# Patient Record
Sex: Male | Born: 1955
Health system: Southern US, Community
[De-identification: ages and names within clinical notes are randomized; demographics above are authoritative.]

## PROBLEM LIST (undated history)

## (undated) DIAGNOSIS — R7302 Impaired glucose tolerance (oral): Secondary | ICD-10-CM

## (undated) DIAGNOSIS — S0285XA Fracture of orbit, unspecified, initial encounter for closed fracture: Secondary | ICD-10-CM

## (undated) DIAGNOSIS — R2689 Other abnormalities of gait and mobility: Secondary | ICD-10-CM

## (undated) DIAGNOSIS — R7303 Prediabetes: Secondary | ICD-10-CM

## (undated) DIAGNOSIS — H269 Unspecified cataract: Secondary | ICD-10-CM

## (undated) DIAGNOSIS — R209 Unspecified disturbances of skin sensation: Secondary | ICD-10-CM

## (undated) DIAGNOSIS — I10 Essential (primary) hypertension: Secondary | ICD-10-CM

## (undated) DIAGNOSIS — K219 Gastro-esophageal reflux disease without esophagitis: Secondary | ICD-10-CM

## (undated) DIAGNOSIS — T7840XA Allergy, unspecified, initial encounter: Secondary | ICD-10-CM

## (undated) DIAGNOSIS — R1319 Other dysphagia: Secondary | ICD-10-CM

## (undated) DIAGNOSIS — J309 Allergic rhinitis, unspecified: Secondary | ICD-10-CM

## (undated) DIAGNOSIS — K2270 Barrett's esophagus without dysplasia: Secondary | ICD-10-CM

## (undated) DIAGNOSIS — L259 Unspecified contact dermatitis, unspecified cause: Secondary | ICD-10-CM

## (undated) DIAGNOSIS — E119 Type 2 diabetes mellitus without complications: Secondary | ICD-10-CM

## (undated) DIAGNOSIS — S0993XA Unspecified injury of face, initial encounter: Secondary | ICD-10-CM

## (undated) DIAGNOSIS — I4949 Other premature depolarization: Secondary | ICD-10-CM

## (undated) HISTORY — DX: Essential (primary) hypertension: I10

## (undated) HISTORY — DX: Gastro-esophageal reflux disease without esophagitis: K21.9

## (undated) HISTORY — DX: Prediabetes: R73.03

## (undated) HISTORY — DX: Impaired glucose tolerance (oral): R73.02

## (undated) HISTORY — DX: Allergic rhinitis, unspecified: J30.9

## (undated) HISTORY — DX: Other premature depolarization: I49.49

## (undated) HISTORY — PX: TONSILLECTOMY: SHX5217

## (undated) HISTORY — DX: Allergy, unspecified, initial encounter: T78.40XA

## (undated) HISTORY — DX: Other dysphagia: R13.19

## (undated) HISTORY — DX: Unspecified cataract: H26.9

## (undated) HISTORY — PX: ESOPHAGOGASTRODUODENOSCOPY: SHX1529

## (undated) HISTORY — DX: Type 2 diabetes mellitus without complications: E11.9

## (undated) HISTORY — DX: Barrett's esophagus without dysplasia: K22.70

## (undated) HISTORY — PX: COLONOSCOPY: SHX174

## (undated) HISTORY — DX: Unspecified disturbances of skin sensation: R20.9

## (undated) HISTORY — DX: Unspecified injury of face, initial encounter: S09.93XA

## (undated) HISTORY — PX: LUMBAR DISC SURGERY: SHX700

## (undated) HISTORY — DX: Unspecified contact dermatitis, unspecified cause: L25.9

## (undated) HISTORY — DX: Fracture of orbit, unspecified, initial encounter for closed fracture: S02.85XA

## (undated) HISTORY — PX: UPPER GASTROINTESTINAL ENDOSCOPY: SHX188

## (undated) HISTORY — DX: Other abnormalities of gait and mobility: R26.89

## (undated) HISTORY — PX: CERVICAL DISCECTOMY: SHX98

---

## 2001-10-15 ENCOUNTER — Encounter: Admission: RE | Admit: 2001-10-15 | Discharge: 2001-10-15 | Payer: Self-pay | Admitting: Orthopaedic Surgery

## 2001-10-15 ENCOUNTER — Encounter: Payer: Self-pay | Admitting: Orthopaedic Surgery

## 2001-11-12 ENCOUNTER — Encounter: Payer: Self-pay | Admitting: Orthopaedic Surgery

## 2001-11-19 ENCOUNTER — Inpatient Hospital Stay (HOSPITAL_COMMUNITY): Admission: RE | Admit: 2001-11-19 | Discharge: 2001-11-20 | Payer: Self-pay | Admitting: Orthopaedic Surgery

## 2001-11-19 ENCOUNTER — Encounter: Payer: Self-pay | Admitting: Orthopaedic Surgery

## 2001-11-20 ENCOUNTER — Encounter: Payer: Self-pay | Admitting: Orthopaedic Surgery

## 2001-11-30 ENCOUNTER — Encounter (INDEPENDENT_AMBULATORY_CARE_PROVIDER_SITE_OTHER): Payer: Self-pay | Admitting: *Deleted

## 2001-11-30 ENCOUNTER — Encounter: Payer: Self-pay | Admitting: Orthopaedic Surgery

## 2001-11-30 ENCOUNTER — Emergency Department (HOSPITAL_COMMUNITY): Admission: EM | Admit: 2001-11-30 | Discharge: 2001-11-30 | Payer: Self-pay | Admitting: Emergency Medicine

## 2003-06-30 HISTORY — PX: ESOPHAGOGASTRODUODENOSCOPY: SHX1529

## 2003-07-24 ENCOUNTER — Encounter: Payer: Self-pay | Admitting: Gastroenterology

## 2005-10-30 ENCOUNTER — Ambulatory Visit: Payer: Self-pay | Admitting: Internal Medicine

## 2006-11-06 DIAGNOSIS — C4491 Basal cell carcinoma of skin, unspecified: Secondary | ICD-10-CM

## 2006-11-06 HISTORY — DX: Basal cell carcinoma of skin, unspecified: C44.91

## 2007-09-23 ENCOUNTER — Encounter: Payer: Self-pay | Admitting: Internal Medicine

## 2008-06-15 ENCOUNTER — Ambulatory Visit: Payer: Self-pay | Admitting: Internal Medicine

## 2008-06-15 LAB — CONVERTED CEMR LAB
ALT: 47 units/L (ref 0–53)
AST: 42 units/L — ABNORMAL HIGH (ref 0–37)
Albumin: 4.4 g/dL (ref 3.5–5.2)
Alkaline Phosphatase: 63 units/L (ref 39–117)
BUN: 9 mg/dL (ref 6–23)
Basophils Absolute: 0.1 10*3/uL (ref 0.0–0.1)
Basophils Relative: 0.7 % (ref 0.0–3.0)
Bilirubin Urine: NEGATIVE
Bilirubin, Direct: 0.1 mg/dL (ref 0.0–0.3)
Blood in Urine, dipstick: NEGATIVE
CO2: 32 meq/L (ref 19–32)
Calcium: 9.4 mg/dL (ref 8.4–10.5)
Chloride: 105 meq/L (ref 96–112)
Cholesterol: 172 mg/dL (ref 0–200)
Creatinine, Ser: 1.2 mg/dL (ref 0.4–1.5)
Eosinophils Absolute: 0.1 10*3/uL (ref 0.0–0.7)
Eosinophils Relative: 1.7 % (ref 0.0–5.0)
GFR calc Af Amer: 82 mL/min
GFR calc non Af Amer: 68 mL/min
Glucose, Bld: 112 mg/dL — ABNORMAL HIGH (ref 70–99)
Glucose, Urine, Semiquant: NEGATIVE
HCT: 49.1 % (ref 39.0–52.0)
HDL: 32.7 mg/dL — ABNORMAL LOW (ref >39.0)
Hemoglobin: 17 g/dL (ref 13.0–17.0)
Ketones, urine, test strip: NEGATIVE
LDL Cholesterol: 111 mg/dL — ABNORMAL HIGH (ref 0–99)
Lymphocytes Relative: 33.3 % (ref 12.0–46.0)
MCHC: 34.7 g/dL (ref 30.0–36.0)
MCV: 91.4 fL (ref 78.0–100.0)
Monocytes Absolute: 0.7 10*3/uL (ref 0.1–1.0)
Monocytes Relative: 8.8 % (ref 3.0–12.0)
Neutro Abs: 4.2 10*3/uL (ref 1.4–7.7)
Neutrophils Relative %: 55.5 % (ref 43.0–77.0)
Nitrite: NEGATIVE
PSA: 0.61 ng/mL (ref 0.10–4.00)
Platelets: 239 10*3/uL (ref 150–400)
Potassium: 4.4 meq/L (ref 3.5–5.1)
Protein, U semiquant: NEGATIVE
RBC: 5.37 M/uL (ref 4.22–5.81)
RDW: 13.1 % (ref 11.5–14.6)
Sodium: 145 meq/L (ref 135–145)
Specific Gravity, Urine: 1.015
TSH: 1.96 microintl units/mL (ref 0.35–5.50)
Total Bilirubin: 0.9 mg/dL (ref 0.3–1.2)
Total CHOL/HDL Ratio: 5.3
Total Protein: 7.5 g/dL (ref 6.0–8.3)
Triglycerides: 141 mg/dL (ref 0–149)
Urobilinogen, UA: 0.2
VLDL: 28 mg/dL (ref 0–40)
WBC Urine, dipstick: NEGATIVE
WBC: 7.7 10*3/uL (ref 4.5–10.5)
pH: 5

## 2008-06-19 ENCOUNTER — Ambulatory Visit: Payer: Self-pay | Admitting: Internal Medicine

## 2008-06-19 DIAGNOSIS — I10 Essential (primary) hypertension: Secondary | ICD-10-CM | POA: Insufficient documentation

## 2008-06-19 DIAGNOSIS — R209 Unspecified disturbances of skin sensation: Secondary | ICD-10-CM

## 2008-06-19 DIAGNOSIS — R202 Paresthesia of skin: Secondary | ICD-10-CM | POA: Insufficient documentation

## 2008-06-19 DIAGNOSIS — K219 Gastro-esophageal reflux disease without esophagitis: Secondary | ICD-10-CM

## 2008-06-19 DIAGNOSIS — J309 Allergic rhinitis, unspecified: Secondary | ICD-10-CM | POA: Insufficient documentation

## 2008-06-19 HISTORY — DX: Unspecified disturbances of skin sensation: R20.9

## 2008-06-19 HISTORY — DX: Essential (primary) hypertension: I10

## 2008-06-19 HISTORY — DX: Gastro-esophageal reflux disease without esophagitis: K21.9

## 2008-06-19 HISTORY — DX: Allergic rhinitis, unspecified: J30.9

## 2008-07-03 ENCOUNTER — Ambulatory Visit: Payer: Self-pay | Admitting: Internal Medicine

## 2008-12-16 DIAGNOSIS — R071 Chest pain on breathing: Secondary | ICD-10-CM | POA: Insufficient documentation

## 2008-12-21 ENCOUNTER — Encounter: Payer: Self-pay | Admitting: Internal Medicine

## 2008-12-21 DIAGNOSIS — K2270 Barrett's esophagus without dysplasia: Secondary | ICD-10-CM | POA: Insufficient documentation

## 2008-12-21 HISTORY — DX: Barrett's esophagus without dysplasia: K22.70

## 2008-12-25 ENCOUNTER — Encounter: Payer: Self-pay | Admitting: Internal Medicine

## 2008-12-25 ENCOUNTER — Ambulatory Visit: Payer: Self-pay | Admitting: Family Medicine

## 2008-12-25 DIAGNOSIS — M545 Low back pain, unspecified: Secondary | ICD-10-CM | POA: Insufficient documentation

## 2008-12-25 DIAGNOSIS — I4949 Other premature depolarization: Secondary | ICD-10-CM

## 2008-12-25 HISTORY — DX: Other premature depolarization: I49.49

## 2008-12-25 LAB — CONVERTED CEMR LAB
Bilirubin Urine: NEGATIVE
Blood in Urine, dipstick: NEGATIVE
Glucose, Urine, Semiquant: NEGATIVE
Ketones, urine, test strip: NEGATIVE
Nitrite: NEGATIVE
Protein, U semiquant: NEGATIVE
Specific Gravity, Urine: 1.02
Urobilinogen, UA: 0.2
WBC Urine, dipstick: NEGATIVE
pH: 6

## 2008-12-29 ENCOUNTER — Ambulatory Visit: Payer: Self-pay | Admitting: Internal Medicine

## 2008-12-29 DIAGNOSIS — R1012 Left upper quadrant pain: Secondary | ICD-10-CM | POA: Insufficient documentation

## 2009-01-01 ENCOUNTER — Encounter: Admission: RE | Admit: 2009-01-01 | Discharge: 2009-01-01 | Payer: Self-pay | Admitting: Internal Medicine

## 2009-02-09 ENCOUNTER — Ambulatory Visit: Payer: Self-pay | Admitting: Gastroenterology

## 2009-02-09 DIAGNOSIS — R1319 Other dysphagia: Secondary | ICD-10-CM | POA: Insufficient documentation

## 2009-02-09 DIAGNOSIS — K921 Melena: Secondary | ICD-10-CM | POA: Insufficient documentation

## 2009-02-09 HISTORY — DX: Other dysphagia: R13.19

## 2009-03-08 ENCOUNTER — Ambulatory Visit: Payer: Self-pay | Admitting: Gastroenterology

## 2009-03-08 ENCOUNTER — Encounter: Payer: Self-pay | Admitting: Gastroenterology

## 2009-03-10 ENCOUNTER — Encounter: Payer: Self-pay | Admitting: Gastroenterology

## 2009-06-25 ENCOUNTER — Ambulatory Visit: Payer: Self-pay | Admitting: Internal Medicine

## 2009-06-25 DIAGNOSIS — L259 Unspecified contact dermatitis, unspecified cause: Secondary | ICD-10-CM | POA: Insufficient documentation

## 2009-06-25 HISTORY — DX: Unspecified contact dermatitis, unspecified cause: L25.9

## 2010-06-28 NOTE — Miscellaneous (Signed)
Summary: Orders Update  Clinical Lists Changes  Problems: Added new problem of BARRETTS ESOPHAGUS (ICD-530.85) Orders: Added new Referral order of Gastroenterology Referral (GI) - Signed

## 2010-06-28 NOTE — Assessment & Plan Note (Signed)
Summary: ROV/MM   Vital Signs:  Patient profile:   55 year old male Height:      75 inches Weight:      260 pounds BMI:     32.62 Temp:     98.2 degrees F oral Pulse rate:   80 / minute Resp:     14 per minute BP sitting:   140 / 90  (left arm)  Vitals Entered By: Willy Eddy, LPN (December 29, 2008 10:55 AM)  CC:  continues to c/o llq pain.  History of Present Illness: 55 year old patient who presents with a two week history of left upper quadrant abdominal discomfort.  Pain seems worse at night and is often aggravated by twisting and stretching type maneuvers.  Denies any constitutional symptoms.  No nausea, vomiting, or change in his bowel habits.  Denies any trauma or unusual activities.  At times he feels tenderness in the left upper quadrant region  the live and Allergies: No Known Drug Allergies  Past History:  Past Medical History: Reviewed history from 06/19/2008 and no changes required. Hypertension Allergic rhinitis impaired glucose tolerance GERD/Barrett's esophagus  Physical Exam  General:  Well-developed,well-nourished,in no acute distress; alert,appropriate and cooperative throughout examination Abdomen:  Bowel sounds positive,abdomen soft and non-tender without masses, organomegaly or hernias noted. Liver edge may have been palpable on full inspiration   Impression & Recommendations:  Problem # 1:  ABDOMINAL PAIN, LEFT UPPER QUADRANT (ICD-789.02)  Orders: Ultrasound (Ultrasound) Radiology Referral (Radiology)  Complete Medication List: 1)  Prilosec Otc 20 Mg Tbec (Omeprazole magnesium) .Marland Kitchen.. 1 once daily 2)  Fluticasone Propionate 50 Mcg/act Susp (Fluticasone propionate) .... Use daily 3)  Benazepril Hcl 20 Mg Tabs (Benazepril hcl) .... One daily  Patient Instructions: 1)  alleve  two tablets twice daily 2)  abdominal ultrasound as scheduled

## 2010-06-28 NOTE — Assessment & Plan Note (Signed)
Summary: PAIN AROUND KIDNEY AREA/MHF   Vital Signs:  Patient profile:   55 year old male Height:      75 inches Weight:      258 pounds BMI:     32.36 Temp:     97.9 degrees F oral BP sitting:   110 / 80  (left arm) Cuff size:   regular  Vitals Entered By: [name] (December 25, 2008 3:33 PM)  Reason for Visit lower back pain  History of Present Illness: Phillip Perry is a 55 year old male patient.  Dr. Kirtland Bouchard and comes in today for evaluation of pain in his left rib area.  X 10 days, plus  He states about 10 days ago he awoke with discomfort in the left side of his ribs.  He points to the 10th 11th, 12th ribs in the posterior axillary line.  He states the pain is dull.  It comes and goes and lasts for a second or two.  His associate with movement.  Does not radiate.  He states his pain on a scale of one to 10 as a 6.  He denies any history of trauma.  Review of systems no fever, nausea, vomiting, diarrhea, change in urinary habits.  He's had no history of kidney stones.  No weight loss.  He has an underlying history of hypertension.  He takes an aspirin 20 mg daily.  BP 110/80.  However, his pulse is regular.  EKG will be done.  Patient is unaware of irregular heart rate  Allergies (verified): No Known Drug Allergies  Past History:  Past medical, surgical, family and social histories (including risk factors) reviewed, and no changes noted (except as noted below).  Past Medical History: Reviewed history from 06/19/2008 and no changes required. Hypertension Allergic rhinitis impaired glucose tolerance GERD/Barrett's esophagus  Past Surgical History: Reviewed history from 06/19/2008 and no changes required. status post EGD February 2005 C5-6 diskectomy 2003 ( [facility]) Tonsillectomy  Age 79  Family History: Reviewed history from 06/19/2008 and no changes required. father died age 50, esophageal cancer mother history of type 2 diabetes  Maternal grandfather, bladder cancer maternal  grandmother, coronary artery disease, advanced age paternal grandfather died in his early 17s of unclear causes  One sister is well  Social History: Reviewed history from 06/19/2008 and no changes required. Married Never Smoked 69 year old daughter  Review of Systems      See HPI  Physical Exam  General:  Well-developed,well-nourished,in no acute distress; alert,appropriate and cooperative throughout examination Chest Wall:  palpable tenderness left posterior chest wall, 10th, 11th, 12th rib area Lungs:  Normal respiratory effort, chest expands symmetrically. Lungs are clear to auscultation, no crackles or wheezes. Heart:  normal cardiac exam, except pulse is irregularly irregular Abdomen:  Bowel sounds positive,abdomen soft and non-tender without masses, organomegaly or hernias noted.   Impression & Recommendations:  Problem # 1:  CHEST WALL PAIN, ACUTE (ZOX-096.04) Assessment New  Problem # 2:  PREMATURE VENTRICULAR CONTRACTIONS (ICD-427.69) Assessment: New  Complete Medication List: 1)  Prilosec Otc 20 Mg Tbec (Omeprazole magnesium) .Marland Kitchen.. 1 once daily 2)  Fluticasone Propionate 50 Mcg/act Susp (Fluticasone propionate) .... Use daily 3)  Benazepril Hcl 20 Mg Tabs (Benazepril hcl) .... One daily  Other Orders: UA Dipstick w/o Micro (automated)  (81003) EKG w/ Interpretation (93000)  Patient Instructions: 1)  stay complete, caffeine free diet. 2)  Take Motrin 600 mg 3 times a day with food for your back pain. 3)  See Dr. Kirtland Bouchard. next Tuesday  for follow-up  Laboratory Results   Urine Tests    Routine Urinalysis   Color: yellow Appearance: Clear Glucose: negative   (Normal Range: Negative) Bilirubin: negative   (Normal Range: Negative) Ketone: negative   (Normal Range: Negative) Spec. Gravity: 1.020   (Normal Range: 1.003-1.035) Blood: negative   (Normal Range: Negative) pH: 6.0   (Normal Range: 5.0-8.0) Protein: negative   (Normal Range: Negative) Urobilinogen:  0.2   (Normal Range: 0-1) Nitrite: negative   (Normal Range: Negative) Leukocyte Esterace: negative   (Normal Range: Negative)    Comments: Joanne Chars CMA  December 25, 2008 3:49 PM

## 2010-06-28 NOTE — Assessment & Plan Note (Signed)
Summary: 2 wk rov/njr   Vital Signs:  Patient Profile:   55 Years Old Male Height:     75 inches Weight:      255 pounds BP sitting:   116 / 82  (left arm) Cuff size:   large  Vitals Entered By: Raechel Ache, RN (July 03, 2008 9:40 AM)                 Chief Complaint:  2 week ROV.Marland Kitchen  History of Present Illness: 55 year old patient seen today for follow-up of his hypertension.  He was placed on benazapril last visit;  he has tolerated his medications well.  He has a history of gastroesophageal reflux disease and allergic rhinitis, which have been stable     Current Allergies: No known allergies   Past Medical History:    Reviewed history from 06/19/2008 and no changes required:       Hypertension       Allergic rhinitis       impaired glucose tolerance       GERD/Barrett's esophagus      Physical Exam  General:     Well-developed,well-nourished,in no acute distress; alert,appropriate and cooperative throughout examination;  110/74    Impression & Recommendations:  Problem # 1:  HYPERTENSION (ICD-401.9)  His updated medication list for this problem includes:    Benazepril Hcl 20 Mg Tabs (Benazepril hcl) ..... One daily   Problem # 2:  ALLERGIC RHINITIS (ICD-477.9)  His updated medication list for this problem includes:    Fluticasone Propionate 50 Mcg/act Susp (Fluticasone propionate) ..... Use daily   Complete Medication List: 1)  Prilosec Otc 20 Mg Tbec (Omeprazole magnesium) .Marland Kitchen.. 1 once daily 2)  Fluticasone Propionate 50 Mcg/act Susp (Fluticasone propionate) .... Use daily 3)  Benazepril Hcl 20 Mg Tabs (Benazepril hcl) .... One daily   Patient Instructions: 1)  Please schedule a follow-up appointment in 6 months. 2)  Limit your Sodium (Salt). 3)  It is important that you exercise regularly at least 20 minutes 5 times a week. If you develop chest pain, have severe difficulty breathing, or feel very tired , stop exercising immediately and  seek medical attention. 4)  You need to lose weight. Consider a lower calorie diet and regular exercise.  5)  Check your Blood Pressure regularly. If it is above:  140/90 you should make an appointment.   Prescriptions: BENAZEPRIL HCL 20 MG TABS (BENAZEPRIL HCL) one daily  #90 x 6   Entered and Authorized by:   Gordy Savers  MD   Signed by:   Gordy Savers  MD on 07/03/2008   Method used:   Print then Give to Patient   RxID:   [id number]

## 2010-06-28 NOTE — Consult Note (Signed)
Summary: Dr Otelia Sergeant note  Dr Otelia Sergeant note   Imported By: Kassie Mends 09/27/2007 09:51:29  _____________________________________________________________________  External Attachment:    Type:   Image     Comment:   Dr Otelia Sergeant note

## 2010-06-28 NOTE — Assessment & Plan Note (Signed)
Summary: cpx/mhf   Vital Signs:  Patient Profile:   55 Years Old Male Height:     75 inches Weight:      255 pounds Temp:     98.4 degrees F oral Pulse rate:   84 / minute Pulse rhythm:   regular BP sitting:   134 / 108  (left arm) Cuff size:   regular  Vitals Entered By: Raechel Ache, RN (June 19, 2008 4:09 PM)                 Chief Complaint:  CPX and labs done. Concerned about feet; funny sensation on bottoms..  History of Present Illness: 55 year old patient who is seen today for an annual exam.  He has a history of Gastrosoft reflux disease and Barrett's esophagus.  He is on chronic omeprazole.  He also has a history of allergic rhinitis and takes Allegra-D chronically.  For the past two years, he has had nonprogressive mild paresthesias involving the plantar surface of the toes.  He does have a family history of diabetes    Current Allergies: No known allergies   Past Medical History:    Reviewed history and no changes required:       Hypertension       Allergic rhinitis       impaired glucose tolerance       GERD/Barrett's esophagus  Past Surgical History:    Reviewed history and no changes required:       status post EGD February 2005       C5-6 diskectomy 2003 ( [facility])       Tonsillectomy  Age 81   Family History:    Reviewed history and no changes required:       father died age 48, esophageal cancer       mother history of type 2 diabetes              Maternal grandfather, bladder cancer       maternal grandmother, coronary artery disease, advanced age       paternal grandfather died in his early 73s of unclear causes              One sister is well  Social History:    Reviewed history and no changes required:       Married       Never Smoked       47 year old daughter   Risk Factors:  Tobacco use:  never   Review of Systems  The patient denies anorexia, fever, weight loss, weight gain, vision loss, decreased hearing, hoarseness,  chest pain, syncope, dyspnea on exertion, peripheral edema, prolonged cough, headaches, hemoptysis, abdominal pain, melena, hematochezia, severe indigestion/heartburn, hematuria, incontinence, genital sores, muscle weakness, suspicious skin lesions, transient blindness, difficulty walking, depression, unusual weight change, abnormal bleeding, enlarged lymph nodes, angioedema, breast masses, and testicular masses.     Physical Exam  General:     Well-developed,well-nourished,in no acute distress; alert,appropriate and cooperative throughout examination  160/100 Head:     Normocephalic and atraumatic without obvious abnormalities. No apparent alopecia or balding. Eyes:     No corneal or conjunctival inflammation noted. EOMI. Perrla. Funduscopic exam benign, without hemorrhages, exudates or papilledema. Vision grossly normal. Ears:     External ear exam shows no significant lesions or deformities.  Otoscopic examination reveals clear canals, tympanic membranes are intact bilaterally without bulging, retraction, inflammation or discharge. Hearing is grossly normal bilaterally. Mouth:     Oral mucosa and  oropharynx without lesions or exudates.  Teeth in good repair. Neck:     No deformities, masses, or tenderness noted. Chest Wall:     No deformities, masses, tenderness or gynecomastia noted. Breasts:     No masses or gynecomastia noted Lungs:     Normal respiratory effort, chest expands symmetrically. Lungs are clear to auscultation, no crackles or wheezes. Heart:     Normal rate and regular rhythm. S1 and S2 normal without gallop, murmur, click, rub or other extra sounds. Abdomen:     Bowel sounds positive,abdomen soft and non-tender without masses, organomegaly or hernias noted. Rectal:     No external abnormalities noted. Normal sphincter tone. No rectal masses or tenderness. Genitalia:     Testes bilaterally descended without nodularity, tenderness or masses. No scrotal masses or  lesions. No penis lesions or urethral discharge. Prostate:     Prostate gland firm and smooth, no enlargement, nodularity, tenderness, mass, asymmetry or induration. Msk:     No deformity or scoliosis noted of thoracic or lumbar spine.   Pulses:     R and L carotid,radial,femoral,dorsalis pedis and posterior tibial pulses are full and equal bilaterally Extremities:     No clubbing, cyanosis, edema, or deformity noted with normal full range of motion of all joints.   Neurologic:     No cranial nerve deficits noted. Station and gait are normal. Plantar reflexes are down-going bilaterally. DTRs are symmetrical throughout. Sensory, motor and coordinative functions appear intact. Skin:     Intact without suspicious lesions or rashes Cervical Nodes:     No lymphadenopathy noted Axillary Nodes:     No palpable lymphadenopathy Inguinal Nodes:     No significant adenopathy Psych:     Cognition and judgment appear intact. Alert and cooperative with normal attention span and concentration. No apparent delusions, illusions, hallucinations    Impression & Recommendations:  Problem # 1:  HYPERTENSION (ICD-401.9) will place on a low-sodium diet, placed on Bowser pill 20 mg daily and recheck in 3 weeks His updated medication list for this problem includes:    Benazepril Hcl 20 Mg Tabs (Benazepril hcl) ..... One daily   Problem # 2:  ALLERGIC RHINITIS (ICD-477.9) will discontinue Claritin-D and placed on antihistamine only along with nasal steroid His updated medication list for this problem includes:    Fluticasone Propionate 50 Mcg/act Susp (Fluticasone propionate) ..... Use daily   Problem # 3:  GERD (ICD-530.81)  His updated medication list for this problem includes:    Prilosec Otc 20 Mg Tbec (Omeprazole magnesium) .Marland Kitchen... 1 once daily need screening colonoscopy and follow-up EGD  Problem # 4:  PARESTHESIA (ICD-782.0)  Complete Medication List: 1)  Prilosec Otc 20 Mg Tbec (Omeprazole  magnesium) .Marland Kitchen.. 1 once daily 2)  Fluticasone Propionate 50 Mcg/act Susp (Fluticasone propionate) .... Use daily 3)  Benazepril Hcl 20 Mg Tabs (Benazepril hcl) .... One daily   Patient Instructions: 1)  Please schedule a follow-up appointment in 2 weeks. 2)  Limit your Sodium (Salt). 3)  Avoid foods high in acid (tomatoes, citrus juices, spicy foods). Avoid eating within two hours of lying down or before exercising. Do not over eat; try smaller more frequent meals. Elevate head of bed twelve inches when sleeping. 4)  It is important that you exercise regularly at least 20 minutes 5 times a week. If you develop chest pain, have severe difficulty breathing, or feel very tired , stop exercising immediately and seek medical attention. 5)  Schedule a colonoscopy/sigmoidoscopy to  help detect colon cancer.   Prescriptions: BENAZEPRIL HCL 20 MG TABS (BENAZEPRIL HCL) one daily  #90 x 0   Entered and Authorized by:   Gordy Savers  MD   Signed by:   Gordy Savers  MD on 06/19/2008   Method used:   Print then Give to Patient   RxID:   [id number] FLUTICASONE PROPIONATE 50 MCG/ACT SUSP (FLUTICASONE PROPIONATE) use daily  #1 x 6   Entered and Authorized by:   Gordy Savers  MD   Signed by:   Gordy Savers  MD on 06/19/2008   Method used:   Print then Give to Patient   RxID:   [id number] PRILOSEC OTC 20 MG TBEC (OMEPRAZOLE MAGNESIUM) 1 once daily  #90 x 6   Entered and Authorized by:   Gordy Savers  MD   Signed by:   Gordy Savers  MD on 06/19/2008   Method used:   Print then Give to Patient   RxID:   [id number]

## 2010-06-28 NOTE — Procedures (Signed)
Summary: Upper Endoscopy  Patient: Phillip Perry Note: All result statuses are Final unless otherwise noted.  Tests: (1) Upper Endoscopy (EGD)   EGD Upper Endoscopy       DONE      [facility]      [address]      [place], [place]  [address]      ?      ENDOSCOPY PROCEDURE REPORT      ?      PATIENT:  Phillip Perry, Phillip Perry  MR#:  [id number]      BIRTHDATE:  [birth year], 53 yrs. old  GENDER:  male      ?      ENDOSCOPIST:  Venita Lick. Russella Dar, MD, Clementeen Graham      ?      PROCEDURE DATE:  03/08/2009      PROCEDURE:  EGD with snare polypectomy, and with biopsy      ASA CLASS:  Class II      INDICATIONS:  Barrett's Esophagus      ?      MEDICATIONS:  There was residual sedation effect present from      prior procedure, Fentanyl 25 mcg IV, Versed 4 mg IV      TOPICAL ANESTHETIC:  Exactacain Spray      ?      DESCRIPTION OF PROCEDURE:   After the risks benefits and      alternatives of the procedure were thoroughly explained, informed      consent was obtained.  The LB [id number] endoscope was      introduced through the mouth and advanced to the second portion of      the duodenum, without limitations.  The instrument was slowly      withdrawn as the mucosa was fully examined.      <<PROCEDUREIMAGES>>      ?      About 20 polyps were found in the body and fundus of the stomach.      They were 3 - 6 mm in size. 8 of the largest polyp was snared,      without cautery and removed. Polyps were retrieved and sent to      pathology.  Barrett's esophagus was found in the distal esophagus.      It was 1 - 2 cm in length. Multiple biopsies were obtained and      sent to pathology.  Otherwise the examination was normal.      Retroflexed views revealed a hiatal hernia, small.  The scope was      then withdrawn from the patient and the procedure completed.      COMPLICATIONS:  None      ?      ENDOSCOPIC IMPRESSION:      1) 3 - 6 mm, multiple polyps in the  body and fundus      2) 1 - 2 cm barrett's esophagus      3) A hiatal hernia      ?      RECOMMENDATIONS:      1) anti-reflux regimen      2) await pathology results      3) continue PPI      4) endoscopy in 3 years if no Barretts associated dysplasia      ?      Venita Lick. Russella Dar, MD, Clementeen Graham      ?      CC:  Gordy Savers, MD      ?  n.      eSIGNED:   Malcolm T. Stark at 03/08/2009 02:26 PM      ?      Trigg, Delarocha, [phone]  Note: An exclamation mark (!) indicates a result that was not dispersed into the flowsheet. Document Creation Date: 03/10/2009 8:49 AM _______________________________________________________________________  (1) Order result status: Final Collection or observation date-time: 03/08/2009 14:03 Requested date-time:  Receipt date-time:  Reported date-time:  Referring Physician:   Ordering Physician: Claudette Head [phone]) Specimen Source:  Source: Launa Grill Order Number: [id number] Lab site:   Appended Document: Upper Endoscopy     Procedures Next Due Date:    EGD: 02/2012

## 2010-06-28 NOTE — Assessment & Plan Note (Signed)
Summary: ? shingles ?/dm   Vital Signs:  Patient profile:   54 year old male Weight:      263 pounds BMI:     32.99 Temp:     98.7 degrees F oral BP sitting:   116 / 98  (left arm) Cuff size:   regular  Vitals Entered By: Raechel Ache, RN (June 25, 2009 3:59 PM) CC: C/o sore spot L forehead.   Primary Care Provider:  Eleonore Chiquito, MD  CC:  C/o sore spot L forehead.Marland Kitchen  History of Present Illness: 55 year old patient who is seen today for evaluation of a lesion involving his left for head region.  He first noted the area approximately 24 hours ago.  He felt this was a temple and has been squeezing the area and has expressed apparently a small amount of fluid.  He does have a remote history of genital herpes.  He was concerned about the possibility of shingles.  He has been applying topical antibiotic ointment.  Allergies: No Known Drug Allergies  Past History:  Past Medical History: Reviewed history from 06/19/2008 and no changes required. Hypertension Allergic rhinitis impaired glucose tolerance GERD/Barrett's esophagus  Review of Systems       The patient complains of suspicious skin lesions.    Physical Exam  General:  Well-developed,well-nourished,in no acute distress; alert,appropriate and cooperative throughout examination; repeat blood pressure 120/82 Skin:  a patchy area of erythema approximately 3 cm in diameter.  Two small excoriated areas were noted.   Impression & Recommendations:  Problem # 1:  DERMATITIS (ICD-692.9) this is a very nonspecific lesion.  It is fairly consistent with a herpetic lesion.  Doubt this represents shingles.  Will treat with local skin care and observe.  He will call if there is any worsening of the dermatitis  Complete Medication List: 1)  Prilosec Otc 20 Mg Tbec (Omeprazole magnesium) .Marland Kitchen.. 1 once daily 2)  Fluticasone Propionate 50 Mcg/act Susp (Fluticasone propionate) .... Use daily 3)  Benazepril Hcl 20 Mg Tabs  (Benazepril hcl) .... One daily 4)  Analpram-hc 1-2.5 % Crea (Hydrocortisone ace-pramoxine) .... Apply to rectum two times a day x 2 weeks.  Patient Instructions: 1)  call that the rash worsens 2)  Limit your Sodium (Salt) to less than 2 grams a day(slightly less than 1/2 a teaspoon) to prevent fluid retention, swelling, or worsening of symptoms. 3)  Check your Blood Pressure regularly. If it is above: 150/90  you should make an appointment.

## 2010-06-28 NOTE — Procedures (Signed)
Summary: Endoscopy   EGD  Procedure date:  07/24/2003  Findings:      Location: [facility]  Findings: Esophagitis (Barrett's) Hiatal Hernia  Patient Name: Phillip Perry, Phillip Perry. MRN:  Procedure Procedures: Panendoscopy (EGD) CPT: 43235.    with biopsy(s)/brushing(s). CPT: D1846139.  Personnel: Endoscopist: Venita Lick. Russella Dar, MD, Clementeen Graham.  Referred By: Gordy Savers, MD.  Exam Location: Exam performed in Outpatient Clinic. Outpatient  Patient Consent: Procedure, Alternatives, Risks and Benefits discussed, consent obtained, from patient. Consent was obtained by the RN.  Indications Symptoms: Reflux symptoms for >10 yrs,  History  Current Medications: Patient is not currently taking Coumadin.  Pre-Exam Physical: Performed Jul 24, 2003  Entire physical exam was normal.  Exam Exam Info: Maximum depth of insertion Duodenum, intended Duodenum. Vocal cords not visualized. Gastric retroflexion performed. ASA Classification: I. Tolerance: excellent.  Sedation Meds: Patient assessed and found to be appropriate for moderate (conscious) sedation. Fentanyl 100 mcg. given IV. Versed 10 mg. given IV. Cetacaine Spray 2 sprays given aerosolized.  Monitoring: BP and pulse monitoring done. Oximetry used. Supplemental O2 given  Findings BARRETT'S ESOPHAGUS: Proximal margin 40 cm from mouth,  distal margin 41 cm. No inflammation present. Biopsy/Barrett's taken. ICD9: Barrett's: 530.2. Comment: R/O short segment Barrett's.  HIATAL HERNIA: Regular, 3 cms. in length. ICD9: Hernia, Hiatal: 553.3. Normal: Fundus to Duodenal 2nd Portion.   Assessment  Diagnoses: 553.3: Hernia, Hiatal.  530.2: Barrett's.   Events  Unplanned Intervention: No unplanned interventions were required.  Unplanned Events: There were no complications. Plans Medication(s): Await pathology. Continue current medications. PPI: QAM,   Patient Education: Patient given standard instructions for:  Hiatal Hernia. Reflux.  Disposition: After procedure patient sent to recovery. After recovery patient sent home.  Scheduling: EGD, to [employer]. Russella Dar, MD, [facility], if Barrett's confirmed on bx around Jul 23, 2005.  Primary Care Provider, to Gordy Savers, MD,    This report was created from the original endoscopy report, which was reviewed and signed by the above listed endoscopist.   cc:  Gordy Savers, MD

## 2010-06-28 NOTE — Procedures (Signed)
Summary: Colonoscopy  Patient: Phillip Perry Note: All result statuses are Final unless otherwise noted.  Tests: (1) Colonoscopy (COL)   COL Colonoscopy           DONE (C)     [facility]     [address]     [place], [place]  [address]           COLONOSCOPY PROCEDURE REPORT           PATIENT:  Phillip Perry, Phillip Perry  MR#:  [id number]     BIRTHDATE:  13-Apr-1956, 53 yrs. old  GENDER:  male           ENDOSCOPIST:  Judie Petit T. Russella Dar, MD, [facility]           PROCEDURE DATE:  03/08/2009     PROCEDURE:  Colon w/ inj sclerosis of hemorrhoids     ASA CLASS:  Class II     INDICATIONS:  1) hematochezia           MEDICATIONS:   Fentanyl 75 mcg IV, Versed 10 mg IV           DESCRIPTION OF PROCEDURE:   After the risks benefits and     alternatives of the procedure were thoroughly explained, informed     consent was obtained.  Digital rectal exam was performed and     revealed no abnormalities.   The LB [id number]-[id number] endoscope     was introduced through the anus and advanced to the cecum, which     was identified by both the appendix and ileocecal valve, without     limitations.  The quality of the prep was excellent, using     MoviPrep.  The instrument was then slowly withdrawn as the colon     was fully examined.     <<PROCEDUREIMAGES>>           FINDINGS:  A normal appearing cecum, ileocecal valve, and     appendiceal orifice were identified. The ascending, hepatic     flexure, transverse, splenic flexure, descending, sigmoid colon,     and rectum appeared unremarkable.  Internal hemorrhoids were     found. They were large. Injection sclerosis of internal     hemorrhoids performed with 4 cc of 23.4% saline with injections     performed well above the dentate line.  Retroflexed views in the     rectum revealed no other findings other than those already     described.  The time to cecum =  2.5  minutes. The scope was then     withdrawn (time =  14.5  min) from the patient and the procedure     completed.           COMPLICATIONS:  None           ENDOSCOPIC IMPRESSION:     1) Normal colon     2) Internal hemorrhoids           RECOMMENDATIONS:     1) Continue current colorectal screening recommendations for     "routine risk" patients with a repeat colonoscopy in 10 years.           Venita Lick. Russella Dar, MD, [facility]           CC: Gordy Savers, MD           n.     REVISED:  03/16/2009 12:09 PM     eSIGNED:   Judie Petit T. Russella Dar at 03/16/2009  12:09 PM           Julian, [place], [id number]  Note: An exclamation mark (!) indicates a result that was not dispersed into the flowsheet. Document Creation Date: 03/16/2009 12:09 PM _______________________________________________________________________  (1) Order result status: Final Collection or observation date-time: 03/08/2009 14:00 Requested date-time:  Receipt date-time:  Reported date-time:  Referring Physician:   Ordering Physician: Claudette Head [id number]) Specimen Source:  Source: Launa Grill Order Number: [id number] Lab site:   Appended Document: Colonoscopy    Clinical Lists Changes  Observations: Changed observation from COLONNXTDUE: 02/2019 (03/10/2009 10:15) to COLONNXTDUE: 02/2019 (03/10/2009 10:15)

## 2010-07-17 ENCOUNTER — Other Ambulatory Visit: Payer: Self-pay | Admitting: Internal Medicine

## 2010-07-21 ENCOUNTER — Encounter (INDEPENDENT_AMBULATORY_CARE_PROVIDER_SITE_OTHER): Payer: BC Managed Care – PPO | Admitting: Internal Medicine

## 2010-07-21 ENCOUNTER — Encounter: Payer: Self-pay | Admitting: Internal Medicine

## 2010-07-24 ENCOUNTER — Encounter: Payer: Self-pay | Admitting: Internal Medicine

## 2010-07-25 ENCOUNTER — Encounter: Payer: Self-pay | Admitting: Internal Medicine

## 2010-07-25 ENCOUNTER — Ambulatory Visit (INDEPENDENT_AMBULATORY_CARE_PROVIDER_SITE_OTHER): Payer: BC Managed Care – PPO | Admitting: Internal Medicine

## 2010-07-25 DIAGNOSIS — J309 Allergic rhinitis, unspecified: Secondary | ICD-10-CM

## 2010-07-25 DIAGNOSIS — I10 Essential (primary) hypertension: Secondary | ICD-10-CM

## 2010-07-25 DIAGNOSIS — K2270 Barrett's esophagus without dysplasia: Secondary | ICD-10-CM

## 2010-07-25 MED ORDER — OMEPRAZOLE 20 MG PO CPDR: 20.0000 mg | DELAYED_RELEASE_CAPSULE | Freq: Every day | ORAL | Status: DC

## 2010-07-25 MED ORDER — FLUTICASONE PROPIONATE 50 MCG/ACT NA SUSP: 1.0000 | Freq: Every day | NASAL | Status: DC

## 2010-07-25 MED ORDER — BENAZEPRIL HCL 20 MG PO TABS: 20.0000 mg | ORAL_TABLET | Freq: Every day | ORAL | Status: DC

## 2010-07-25 NOTE — Patient Instructions (Signed)
Limit your sodium (Salt) intake    It is important that you exercise regularly, at least 20 minutes 3 to 4 times per week.  If you develop chest pain or shortness of breath seek  medical attention.  Please check your blood pressure on a regular basis.  If it is consistently greater than 150/90, please make an office appointment.  You need to lose weight.  Consider a lower calorie diet and regular exercise. 

## 2010-07-25 NOTE — Progress Notes (Signed)
  Subjective:    Patient ID: Phillip Perry, male    DOB: 1955/07/23, 55 y.o.   MRN: [id number]  HPI   55 year old patient who is seen today for followup. He basically is in need of medication refills. He has a history of hypertension which has been controlled on Lotensin. He has a history of Barrett's esophagus and has recently run out of Prilosec. He is doing quite well and is asymptomatic.   Review of Systems  Constitutional: Negative for fever, chills, appetite change and fatigue.  HENT: Negative for hearing loss, ear pain, congestion, sore throat, trouble swallowing, neck stiffness, dental problem, voice change and tinnitus.   Eyes: Negative for pain, discharge and visual disturbance.  Respiratory: Negative for cough, chest tightness, wheezing and stridor.   Cardiovascular: Negative for chest pain, palpitations and leg swelling.  Gastrointestinal: Negative for nausea, vomiting, abdominal pain, diarrhea, constipation, blood in stool and abdominal distention.  Genitourinary: Negative for urgency, hematuria, flank pain, discharge, difficulty urinating and genital sores.  Musculoskeletal: Negative for myalgias, back pain, joint swelling, arthralgias and gait problem.  Skin: Negative for rash.  Neurological: Negative for dizziness, syncope, speech difficulty, weakness, numbness and headaches.  Hematological: Negative for adenopathy. Does not bruise/bleed easily.  Psychiatric/Behavioral: Negative for behavioral problems and dysphoric mood. The patient is not nervous/anxious.        Objective:   Physical Exam  Constitutional: He is oriented to person, place, and time. He appears well-developed.  HENT:  Head: Normocephalic.  Right Ear: External ear normal.  Left Ear: External ear normal.  Eyes: Conjunctivae and EOM are normal.  Neck: Normal range of motion.  Cardiovascular: Normal rate and normal heart sounds.   Pulmonary/Chest: Breath sounds normal.  Abdominal: Bowel sounds are normal.    Musculoskeletal: Normal range of motion. He exhibits no edema and no tenderness.  Neurological: He is alert and oriented to person, place, and time.  Psychiatric: He has a normal mood and affect. His behavior is normal.          Assessment & Plan:   Barrett's  Esophagus- Will refill Prilosec  Hypertension we'll refill Lotensin   We'll schedule complete exam at his convenience

## 2010-10-14 NOTE — H&P (Signed)
[facility]  Patient:    Phillip Perry, Phillip Perry Visit Number: [id number] MRN: [id number]          Service Type: Attending:  Patricia Nettle, M.D. Dictated by:   Ottie Glazier. Wynona Neat, P.A.-C. Adm. Date:  11/19/01                           History and Physical  DATE OF BIRTH:  [birth year]  CHIEF COMPLAINT:  Right upper extremity pain.  HISTORY OF PRESENT ILLNESS:  The patient is a 55 year old male who has a long history of right upper extremity pain and paresthesias extending from his neck to shoulder and to his right hand in a C6 distribution.  The patient has been seen and evaluated by Dr. Noel Gerold and found to have cervical spondylosis and degenerative disk disease at C5-C6.  Due to his increasing symptomatology he is opting to undergo surgical intervention.  ALLERGIES:  No known drug allergies.  MEDICATIONS:  Vicodin, Celebrex, and Zantac 75 mg b.i.d.  PAST MEDICAL HISTORY: 1. Cluster headaches, which he relates to sinus problems.  Therefore, this    does not sound like a classic cluster headache but more a sinus headache. 2. GERD. 3. Hemorrhoids.  PAST SURGICAL HISTORY:  Tonsillectomy.  SOCIAL HISTORY:  The patient is married.  He is a Runner, broadcasting/film/video.  He denies any tobacco use.  He drinks approximately five alcoholic drinks weekly.  FAMILY HISTORY:  His mother had diabetes mellitus.  Father had esophageal cancer.  REVIEW OF SYSTEMS:  Significant for corrective lenses and hemorrhoids; otherwise, noncontributory.  PHYSICAL EXAMINATION:  VITAL SIGNS:  Blood pressure 110/82, respirations 12, pulse 60.  GENERAL:  Extremely pleasant 55 year old male appearing his stated age.  HEENT:  Head atraumatic, normocephalic.  NECK:  Supple.  Without masses or carotid bruits.  CHEST:  Clear to auscultation bilaterally.  BREASTS:  Deferred, not pertinent to present illness.  HEART:  Regular rate and rhythm.  S1, S2.  ABDOMEN:  Soft and nontender.  Bowel sounds  positive.  GENITOURINARY:  Deferred, not pertinent to present illness.  EXTREMITIES:  The patient has decreased sensation C6 distribution.  Biceps reflexes diminished.  Impingement testing of the shoulder is negative.  The AC joints are nontender.  Spurling maneuver is positive.  LABORATORY DATA:  CT shows mild spondylitic changes with mild left foraminal stenosis at C3-4, and at C4-5 there is again spondylitic change with mild right foraminal stenosis.  At C5-6 there is more significant spondylitic changes with right greater than left foraminal stenosis which is superimposed upon a soft disk protrusion.  IMPRESSION: 1. Cervical spondylitic radiculopathy. 2. History of headaches, cluster versus sinus. 3. History of gastroesophageal reflux disease. 4. History of hemorrhoids.  PLAN:  The patient will be admitted to [facility] to undergo an anterior cervical diskectomy, foraminotomy at C5-6 and possibly C4-5 with left anterior iliac crest bone graft per Dr. Noel Gerold on November 19, 2001, at 1 p.m. Dictated by:   Ottie Glazier. Wynona Neat, P.A.-C. Attending:  Patricia Nettle, M.D. DD:  11/15/01 TD:  11/16/01 Job: [id number]

## 2010-10-14 NOTE — Op Note (Signed)
[facility]  Patient:    Phillip Perry, Phillip Perry Visit Number: [id number] MRN: [id number]          Service Type: SUR Location: 4W 6213 01 Attending Physician:  Patricia Nettle Dictated by:   Patricia Nettle, M.D. Proc. Date: 11/19/01 Admit Date:  11/19/2001 Discharge Date: 11/20/2001                             Operative Report  PREOPERATIVE DIAGNOSES: 1. Cervical spondylitic radiculopathy, right C5-6. 2. Multilevel cervical spondylosis.  PROCEDURES: 1. Anterior cervical diskectomy, C5-6, with decompression of the    spinal cord and nerve root bilaterally. 2. Anterior cervical arthrodesis, C5-6 with placement of a 9 mm    Synthes allograft prosthesis spacer; lordotic. 3. Anterior cervical plating, C5-6, utilizing the Synthes small stature    locking plate. 4. Left anterior iliac crest bone graft, morcellized. 5. Neuro monitoring, utilizing SSEP.  SURGEON:  Patricia Nettle, M.D.  ASSISTANT:  Lucile Crater, P.A.  ANESTHESIA:  General endotracheal.  COMPLICATIONS:  None.  INDICATIONS AND FINDINGS:  The patient is a 55 year old male with a long history of neck and right upper extremity pain in a C6 distribution.  EMG showed a C5-6 radiculopathy.  Neurologic exam was consistent with a C5 and C6 right radiculopathy.  MRI and CT myelogram showed multilevel spondylitic changes, most severe at C5-6, with foraminal stenosis.  He does have significant findings at C3-4, as well as C4-5.  This was all discussed with the patient and it was decided to treat the most severe level at C5-6 in hopes of improving his symptoms.  He is aware of the significant risk of developing adjacent segment disease and the need for future surgery.  Risks, benefits, alternatives were all reviewed in detail.  The patient elected to proceed.  At surgery the C5-6 level was found to be degenerative, with osteophytes both anterior as well as posterior.  There were large uncinate vertebral spurs  that were burred down and removed with the Kerrison.  There was also a soft component of disk material centrally as well as posterolaterally.  This was all removed.  His bone quality was good.  DESCRIPTION OF PROCEDURE:  The patient was properly identified in the holding area and taken to the operating room.  He underwent general endotracheal anesthesia without difficulty.  He was given prophylactic IV antibiotics. Neuro monitoring was established in the form of SSEP.  The patient was carefully positioned with the neck in slight hyperextension.  A Holter traction was placed with 5 pounds of weight.  All bony prominences were padded.  The neck was prepped and draped in the usual sterile fashion. A left anterior iliac crest was also prepped and draped.  A 4 cm incision was made just above the cricoid cartilage transversely, starting from the midline and extending to the lateral border of the sternocleidomastoid muscle.  The platysma was incised sharply.  The border of the sternocleidomastoid muscle was identified in the avascular plane and developed there.  The interval between the SCM and strap muscles were then developed bluntly down to the anterior cervical spine.  We identified a disk space, placed a spinal needle there, and took a localizing x-ray.  While these were being developed, our attention was directed the left anterior iliac crest.  A 1.5 cm incision was made 2 cm posterior to the anterior iliac spine.  This was carried down to the deep fascia.  Deep fascia was incised and the iliac crest exposed.  We then used a 10 mm ________ bone harvester to collect 2 cc of cancellous bone.  The wound was irrigated.  The deep fascia was closed with #1 Vicryl, followed by 2-0 Vicryl in subcutaneous layer, and then a running 4-0 Vicryl following with Benzoin and Steri-Strips.  Our attention was then directed back to the cervical spine.  Our localizing x-ray showed that we were at C6-7; we  worked up from there.  We exposed the C5-6 disk space, as well as the vertebral body above and below.  Care was taken to protect the adjacent disk.  We then elevated the longus colli muscle bilaterally and placed our deep protractors.  A subtotal diskectomy was performed by creating annulotomy with a #15 blade, then using a pituitary and curets to remove all the disk material back to the posterior longitudinal ligament.  The microscope was prepped and draped and brought into the sterile field.  Using microdissection we exposed PLL.  We placed our [facility] distractor and applied gentle distraction.  A high-speed bur was then used to burr down the large protruding uncinate vertebral spurs posteriorly.  This was taken all the way back to the PLL.  We then used 1 mm Kerrison to undercut the vertebral body both superiorly and inferiorly.  The PLL was taken down, exposing the epidural space.  We explored the epidural space with a nerve hook, confirming that there were no subligamentous herniations.  The Kerrisons were used to free foraminotomies bilaterally.  We then passed the bone probe, confirming patency of the foramen bilaterally.  There was a soft component disk and annular material that was all removed, completely decompressing the underlying spinal cord as well as nerve roots bilaterally.  The wound was irrigated.  We prepared the end plates with a high-speed bur.  We then used trial spacers, identifying 9 mm Synthes lordotic spacer, and packed that full of the cancellous bone that had been harvested from the hip.  This was gently impacted into position and countersunk approximately 1-2 mm.  We then prepared the anterior cervical spine for plating.  An 18 mm Synthes myofascial plate was placed in the midline with four 14 mm standard screws.  The locking caps were placed.  We then took intraoperative x-rays, showing excellent positioning of the bone graft and plate.  The wound was irrigated.   A Penrose drain was placed deep.  The esophagus and carotid sheath was examined.  There was no apparent injury.  All bleeding was controlled with bipolar  electrocautery and Gelfoam.  The platysma was closed with the interrupted 2-0 Vicryl suture.  Subcutaneous layer was closed with 3-0 Vicryl, followed by 4-0 Vicryl on the subcuticular skin.  Benzoin and Steri-Strips were placed. Sterile dressing was applied.  Aspen cervical collar was placed, and the patient was extubated without difficulty.  There were no deleterious changes of the SSEP throughout the procedure.  It should be noted that the weight was removed from the patients head before the plate was placed.  He was able to move his upper and lower extremities when he was transferred to the recovery room in stable condition. Dictated by:   Patricia Nettle, M.D. Attending Physician:  Patricia Nettle. DD:  11/19/01 TD:  11/20/01 Job: [id number]

## 2011-06-16 ENCOUNTER — Ambulatory Visit (INDEPENDENT_AMBULATORY_CARE_PROVIDER_SITE_OTHER): Payer: BC Managed Care – PPO | Admitting: Internal Medicine

## 2011-06-16 ENCOUNTER — Encounter: Payer: Self-pay | Admitting: Internal Medicine

## 2011-06-16 DIAGNOSIS — J309 Allergic rhinitis, unspecified: Secondary | ICD-10-CM

## 2011-06-16 DIAGNOSIS — I10 Essential (primary) hypertension: Secondary | ICD-10-CM

## 2011-06-16 MED ORDER — OMEPRAZOLE 20 MG PO CPDR: 20.0000 mg | DELAYED_RELEASE_CAPSULE | Freq: Every day | ORAL | Status: DC

## 2011-06-16 MED ORDER — FLUTICASONE PROPIONATE 50 MCG/ACT NA SUSP: 1.0000 | Freq: Every day | NASAL | Status: DC

## 2011-06-16 MED ORDER — BENAZEPRIL HCL 20 MG PO TABS: 20.0000 mg | ORAL_TABLET | Freq: Every day | ORAL | Status: DC

## 2011-06-16 NOTE — Progress Notes (Signed)
  Subjective:    Patient ID: Phillip Perry, male    DOB: 02-Nov-1955, 56 y.o.   MRN: [id number]  HPI  56 year old patient who has a history of treated hypertension and mild allergic rhinitis. He presents with a 5 day history of loose diarrheal stool this occurs approximately 4 times in a 24-hour period there is no significant abdominal pain nausea or vomiting. He has modified his diet and using Imodium AD     Review of Systems  Constitutional: Negative for fever, chills, appetite change and fatigue.  HENT: Negative for hearing loss, ear pain, congestion, sore throat, trouble swallowing, neck stiffness, dental problem, voice change and tinnitus.   Eyes: Negative for pain, discharge and visual disturbance.  Respiratory: Negative for cough, chest tightness, wheezing and stridor.   Cardiovascular: Negative for chest pain, palpitations and leg swelling.  Gastrointestinal: Positive for diarrhea. Negative for nausea, vomiting, abdominal pain, constipation, blood in stool and abdominal distention.  Genitourinary: Negative for urgency, hematuria, flank pain, discharge, difficulty urinating and genital sores.  Musculoskeletal: Negative for myalgias, back pain, joint swelling, arthralgias and gait problem.  Skin: Negative for rash.  Neurological: Negative for dizziness, syncope, speech difficulty, weakness, numbness and headaches.  Hematological: Negative for adenopathy. Does not bruise/bleed easily.  Psychiatric/Behavioral: Negative for behavioral problems and dysphoric mood. The patient is not nervous/anxious.        Objective:   Physical Exam  Constitutional: He is oriented to person, place, and time. He appears well-developed.  HENT:  Head: Normocephalic.  Right Ear: External ear normal.  Left Ear: External ear normal.  Eyes: Conjunctivae and EOM are normal.  Neck: Normal range of motion.  Cardiovascular: Normal rate and normal heart sounds.   Pulmonary/Chest: Breath sounds normal.  Abdominal:  Bowel sounds are normal.  Musculoskeletal: Normal range of motion. He exhibits no edema and no tenderness.  Neurological: He is alert and oriented to person, place, and time.  Psychiatric: He has a normal mood and affect. His behavior is normal.          Assessment & Plan:    Diarrhea. Appears to be mild and hopefully a self-limiting. Instructions concerning diarrhea dispensed he'll continue Imodium ad as needed and we'll consider a probiotic  Hypertension stable. Medications refilled;  he has been asked return in 6 months for an annual exam

## 2011-06-16 NOTE — Patient Instructions (Addendum)
Limit your sodium (Salt) intake    It is important that you exercise regularly, at least 20 minutes 3 to 4 times per week.  If you develop chest pain or shortness of breath seek  medical attention.  Return in 6 months for follow-up and annual exam  Diarrhea Infections caused by germs (bacterial) or a virus commonly cause diarrhea. Your caregiver has determined that with time, rest and fluids, the diarrhea should improve. In general, eat normally while drinking more water than usual. Although water may prevent dehydration, it does not contain salt and minerals (electrolytes). Broths, weak tea without caffeine and oral rehydration solutions (ORS) replace fluids and electrolytes. Small amounts of fluids should be taken frequently. Large amounts at one time may not be tolerated. Plain water may be harmful in infants and the elderly. Oral rehydrating solutions (ORS) are available at pharmacies and grocery stores. ORS replace water and important electrolytes in proper proportions. Sports drinks are not as effective as ORS and may be harmful due to sugars worsening diarrhea.  ORS is especially recommended for use in children with diarrhea. As a general guideline for children, replace any new fluid losses from diarrhea and/or vomiting with ORS as follows:     If your child weighs 22 pounds or under (10 kg or less), give 60-120 mL ( -  cup or 2 - 4 ounces) of ORS for each episode of diarrheal stool or vomiting episode.     If your child weighs more than 22 pounds (more than 10 kgs), give 120-240 mL ( - 1 cup or 4 - 8 ounces) of ORS for each diarrheal stool or episode of vomiting.     While correcting for dehydration, children should eat normally. However, foods high in sugar should be avoided because this may worsen diarrhea. Large amounts of carbonated soft drinks, juice, gelatin desserts and other highly sugared drinks should be avoided.     After correction of dehydration, other liquids that are  appealing to the child may be added. Children should drink small amounts of fluids frequently and fluids should be increased as tolerated. Children should drink enough fluids to keep urine clear or pale yellow.     Adults should eat normally while drinking more fluids than usual. Drink small amounts of fluids frequently and increase as tolerated. Drink enough fluids to keep urine clear or pale yellow. Broths, weak decaffeinated tea, lemon lime soft drinks (allowed to go flat) and ORS replace fluids and electrolytes.     Avoid:    Carbonated drinks.     Juice.    Extremely hot or cold fluids.     Caffeine drinks.     Fatty, greasy foods.     Alcohol.    Tobacco.    Too much intake of anything at one time.     Gelatin desserts.     Probiotics are active cultures of beneficial bacteria. They may lessen the amount and number of diarrheal stools in adults. Probiotics can be found in yogurt with active cultures and in supplements.     Wash hands well to avoid spreading bacteria and virus.     Anti-diarrheal medications are not recommended for infants and children.     Only take over-the-counter or prescription medicines for pain, discomfort or fever as directed by your caregiver. Do not give aspirin to children because it may cause Reye's Syndrome.     For adults, ask your caregiver if you should continue all prescribed and over-the-counter medicines.  If your caregiver has given you a follow-up appointment, it is very important to keep that appointment. Not keeping the appointment could result in a chronic or permanent injury, and disability. If there is any problem keeping the appointment, you must call back to this facility for assistance.  SEEK IMMEDIATE MEDICAL CARE IF:    You or your child is unable to keep fluids down or other symptoms or problems become worse in spite of treatment.     Vomiting or diarrhea develops and becomes persistent.     There is vomiting of blood  or bile (green material).     There is blood in the stool or the stools are black and tarry.     There is no urine output in 6-8 hours or there is only a small amount of very dark urine.     Abdominal pain develops, increases or localizes.     You have a fever.     Your baby is older than 3 months with a rectal temperature of 102 F (38.9 C) or higher.     Your baby is 79 months old or younger with a rectal temperature of 100.4 F (38 C) or higher.     You or your child develops excessive weakness, dizziness, fainting or extreme thirst.     You or your child develops a rash, stiff neck, severe headache or become irritable or sleepy and difficult to awaken.  MAKE SURE YOU:    Understand these instructions.     Will watch your condition.     Will get help right away if you are not doing well or get worse.  Document Released: 05/05/2002 Document Revised: 01/25/2011 Document Reviewed: 03/22/2009 [facility] Patient Information 2012 [place], [place].

## 2011-06-19 ENCOUNTER — Other Ambulatory Visit: Payer: Self-pay

## 2011-06-19 MED ORDER — OMEPRAZOLE MAGNESIUM 20 MG PO TBEC: 20.0000 mg | DELAYED_RELEASE_TABLET | Freq: Every day | ORAL | Status: DC

## 2011-07-25 NOTE — Progress Notes (Signed)
This encounter was created in error - please disregard.

## 2011-08-29 ENCOUNTER — Emergency Department (HOSPITAL_COMMUNITY)
Admission: EM | Admit: 2011-08-29 | Discharge: 2011-08-29 | Disposition: A | Discharge status: Home / Self Care | Payer: BC Managed Care – PPO | Attending: Emergency Medicine | Admitting: Emergency Medicine

## 2011-08-29 ENCOUNTER — Encounter (HOSPITAL_COMMUNITY): Payer: Self-pay

## 2011-08-29 ENCOUNTER — Emergency Department (HOSPITAL_COMMUNITY): Payer: BC Managed Care – PPO

## 2011-08-29 ENCOUNTER — Ambulatory Visit
Admission: RE | Admit: 2011-08-29 | Discharge: 2011-08-29 | Disposition: A | Discharge status: Home / Self Care | Payer: BC Managed Care – PPO | Source: Ambulatory Visit | Attending: Specialist | Admitting: Specialist

## 2011-08-29 ENCOUNTER — Other Ambulatory Visit: Payer: Self-pay | Admitting: Specialist

## 2011-08-29 ENCOUNTER — Other Ambulatory Visit: Payer: Self-pay

## 2011-08-29 DIAGNOSIS — R200 Anesthesia of skin: Secondary | ICD-10-CM

## 2011-08-29 DIAGNOSIS — Z79899 Other long term (current) drug therapy: Secondary | ICD-10-CM | POA: Insufficient documentation

## 2011-08-29 DIAGNOSIS — R112 Nausea with vomiting, unspecified: Secondary | ICD-10-CM | POA: Insufficient documentation

## 2011-08-29 DIAGNOSIS — R52 Pain, unspecified: Secondary | ICD-10-CM

## 2011-08-29 DIAGNOSIS — I10 Essential (primary) hypertension: Secondary | ICD-10-CM | POA: Insufficient documentation

## 2011-08-29 DIAGNOSIS — S0003XA Contusion of scalp, initial encounter: Secondary | ICD-10-CM | POA: Insufficient documentation

## 2011-08-29 DIAGNOSIS — S0990XA Unspecified injury of head, initial encounter: Secondary | ICD-10-CM | POA: Insufficient documentation

## 2011-08-29 DIAGNOSIS — R209 Unspecified disturbances of skin sensation: Secondary | ICD-10-CM | POA: Insufficient documentation

## 2011-08-29 DIAGNOSIS — R220 Localized swelling, mass and lump, head: Secondary | ICD-10-CM | POA: Insufficient documentation

## 2011-08-29 DIAGNOSIS — K219 Gastro-esophageal reflux disease without esophagitis: Secondary | ICD-10-CM | POA: Insufficient documentation

## 2011-08-29 DIAGNOSIS — R609 Edema, unspecified: Secondary | ICD-10-CM

## 2011-08-29 DIAGNOSIS — K2270 Barrett's esophagus without dysplasia: Secondary | ICD-10-CM | POA: Insufficient documentation

## 2011-08-29 DIAGNOSIS — R6884 Jaw pain: Secondary | ICD-10-CM

## 2011-08-29 NOTE — Discharge Instructions (Signed)
Concussion and Brain Injury A blow or jolt to the head can disrupt the normal function of the brain. This type of brain injury is often called a "concussion" or a "closed head injury." Concussions are usually not life-threatening. Even so, the effects of a concussion can be serious.  CAUSES  A concussion is caused by a blunt blow to the head. The blow might be direct or indirect as described below.  Direct blow (running into another player during a soccer game, being hit in a fight, or hitting your head on a hard surface).   Indirect blow (when your head moves rapidly and violently back and forth like in a car crash).  SYMPTOMS  The brain is very complex. Every head injury is different. Some symptoms may appear right away. Other symptoms may not show up for days or weeks after the concussion. The signs of concussion can be hard to notice. Early on, problems may be missed by patients, family members, and caregivers. You may look fine even though you are acting or feeling differently.  These symptoms are usually temporary, but may last for days, weeks, or even longer. Symptoms include:  Mild headaches that will not go away.   Having more trouble than usual with:   Remembering things.   Paying attention or concentrating.   Organizing daily tasks.   Making decisions and solving problems.   Slowness in thinking, acting, speaking, or reading.   Getting lost or easily confused.   Feeling tired all the time or lacking energy (fatigue).   Feeling drowsy.   Sleep disturbances.   Sleeping more than usual.   Sleeping less than usual.   Trouble falling asleep.   Trouble sleeping (insomnia).   Loss of balance or feeling lightheaded or dizzy.   Nausea or vomiting.   Numbness or tingling.   Increased sensitivity to:   Sounds.   Lights.   Distractions.  Other symptoms might include:  Vision problems or eyes that tire easily.   Diminished sense of taste or smell.   Ringing  in the ears.   Mood changes such as feeling sad, anxious, or listless.   Becoming easily irritated or angry for little or no reason.   Lack of motivation.  DIAGNOSIS  Your caregiver can usually diagnose a concussion or mild brain injury based on your description of your injury and your symptoms.  Your evaluation might include:  A brain scan to look for signs of injury to the brain. Even if the test shows no injury, you may still have a concussion.   Blood tests to be sure other problems are not present.  TREATMENT   People with a concussion need to be examined and evaluated. Most people with concussions are treated in an emergency department, urgent care, or clinic. Some people must stay in the hospital overnight for further treatment.   Your caregiver will send you home with important instructions to follow. Be sure to carefully follow them.   Tell your caregiver if you are already taking any medicines (prescription, over-the-counter, or natural remedies), or if you are drinking alcohol or taking illegal drugs. Also, talk with your caregiver if you are taking blood thinners (anticoagulants) or aspirin. These drugs may increase your chances of complications. All of this is important information that may affect treatment.   Only take over-the-counter or prescription medicines for pain, discomfort, or fever as directed by your caregiver.  PROGNOSIS  How fast people recover from brain injury varies from person to person.   Although most people have a good recovery, how quickly they improve depends on many factors. These factors include how severe their concussion was, what part of the brain was injured, their age, and how healthy they were before the concussion.  Because all head injuries are different, so is recovery. Most people with mild injuries recover fully. Recovery can take time. In general, recovery is slower in older persons. Also, persons who have had a concussion in the past or have  other medical problems may find that it takes longer to recover from their current injury. Anxiety and depression may also make it harder to adjust to the symptoms of brain injury. HOME CARE INSTRUCTIONS  Return to your normal activities slowly, not all at once. You must give your body and brain enough time for recovery.  Get plenty of sleep at night, and rest during the day. Rest helps the brain to heal.   Avoid staying up late at night.   Keep the same bedtime hours on weekends and weekdays.   Take daytime naps or rest breaks when you feel tired.   Limit activities that require a lot of thought or concentration (brain or cognitive rest). This includes:   Homework or job-related work.   Watching TV.   Computer work.   Avoid activities that could lead to a second brain injury, such as contact or recreational sports, until your caregiver says it is okay. Even after your brain injury has healed, you should protect yourself from having another concussion.   Ask your caregiver when you can return to your normal activities such as driving, bicycling, or operating heavy equipment. Your ability to react may be slower after a brain injury.   Talk with your caregiver about when you can return to work or school.   Inform your teachers, school nurse, school counselor, coach, athletic trainer, or work manager about your injury, symptoms, and restrictions. They should be instructed to report:   Increased problems with attention or concentration.   Increased problems remembering or learning new information.   Increased time needed to complete tasks or assignments.   Increased irritability or decreased ability to cope with stress.   Increased symptoms.   Take only those medicines that your caregiver has approved.   Do not drink alcohol until your caregiver says you are well enough to do so. Alcohol and certain other drugs may slow your recovery and can put you at risk of further injury.    If it is harder than usual to remember things, write them down.   If you are easily distracted, try to do one thing at a time. For example, do not try to watch TV while fixing dinner.   Talk with family members or close friends when making important decisions.   Keep all follow-up appointments. Repeated evaluation of your symptoms is recommended for your recovery.  PREVENTION  Protect your head from future injury. It is very important to avoid another head or brain injury before you have recovered. In rare cases, another injury has lead to permanent brain damage, brain swelling, or death. Avoid injuries by using:  Seatbelts when riding in a car.   Alcohol only in moderation.   A helmet when biking, skiing, skateboarding, skating, or doing similar activities.   Safety measures in your home.   Remove clutter and tripping hazards from floors and stairways.   Use grab bars in bathrooms and handrails by stairs.   Place non-slip mats on floors and in bathtubs.     Improve lighting in dim areas.  SEEK MEDICAL CARE IF:  A head injury can cause lingering symptoms. You should seek medical care if you have any of the following symptoms for more than 3 weeks after your injury or are planning to return to sports:  Chronic headaches.   Dizziness or balance problems.   Nausea.   Vision problems.   Increased sensitivity to noise or light.   Depression or mood swings.   Anxiety or irritability.   Memory problems.   Difficulty concentrating or paying attention.   Sleep problems.   Feeling tired all the time.  SEEK IMMEDIATE MEDICAL CARE IF:  You have had a blow or jolt to the head and you (or your family or friends) notice:  Severe or worsening headaches.   Weakness (even if only in one hand or one leg or one part of the face), numbness, or decreased coordination.   Repeated vomiting.   Increased sleepiness or passing out.   One black center of the eye (pupil) is larger  than the other.   Convulsions (seizures).   Slurred speech.   Increasing confusion, restlessness, agitation, or irritability.   Lack of ability to recognize people or places.   Neck pain.   Difficulty being awakened.   Unusual behavior changes.   Loss of consciousness.  Older adults with a brain injury may have a higher risk of serious complications such as a blood clot on the brain. Headaches that get worse or an increase in confusion are signs of this complication. If these signs occur, see a caregiver right away. MAKE SURE YOU:   Understand these instructions.   Will watch your condition.   Will get help right away if you are not doing well or get worse.  FOR MORE INFORMATION  Several groups help people with brain injury and their families. They provide information and put people in touch with local resources. These include support groups, rehabilitation services, and a variety of health care professionals. Among these groups, the Brain Injury Association (BIA, www.biausa.org) has a national office that gathers scientific and educational information and works on a national level to help people with brain injury.  Document Released: 08/05/2003 Document Revised: 05/04/2011 Document Reviewed: 01/01/2008 Exit[employer] Patient Information 2012 [employer]. 
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
## 2011-08-29 NOTE — ED Notes (Signed)
Patient transported to CT 

## 2012-01-23 ENCOUNTER — Encounter: Payer: Self-pay | Admitting: Gastroenterology

## 2012-05-28 ENCOUNTER — Ambulatory Visit (INDEPENDENT_AMBULATORY_CARE_PROVIDER_SITE_OTHER): Payer: BC Managed Care – PPO | Admitting: Internal Medicine

## 2012-05-28 ENCOUNTER — Encounter: Payer: Self-pay | Admitting: Internal Medicine

## 2012-05-28 VITALS — BP 130/90 | HR 88 | Temp 98.1°F | Resp 18 | Wt 260.0 lb

## 2012-05-28 DIAGNOSIS — I10 Essential (primary) hypertension: Secondary | ICD-10-CM

## 2012-05-28 DIAGNOSIS — J309 Allergic rhinitis, unspecified: Secondary | ICD-10-CM

## 2012-05-28 MED ORDER — FLUTICASONE PROPIONATE 50 MCG/ACT NA SUSP: 2.0000 | Freq: Every day | NASAL | Status: DC

## 2012-05-28 MED ORDER — OMEPRAZOLE MAGNESIUM 20 MG PO TBEC: 20.0000 mg | DELAYED_RELEASE_TABLET | Freq: Every day | ORAL | Status: DC

## 2012-05-28 MED ORDER — BENAZEPRIL HCL 20 MG PO TABS: 20.0000 mg | ORAL_TABLET | Freq: Every day | ORAL | Status: DC

## 2012-05-28 NOTE — Progress Notes (Signed)
Subjective:    Patient ID: Phillip Perry, male    DOB: 18-Nov-1955, 56 y.o.   MRN: [id number]  HPI  56 year old patient who has treated hypertension and a history of allergic rhinitis. He is done quite well. No new concerns or complaints. Has not been seen in about one year.  Past Medical History  Diagnosis Date  . ALLERGIC RHINITIS 06/19/2008  . BARRETTS ESOPHAGUS 12/21/2008  . DERMATITIS 06/25/2009  . DYSPHAGIA 02/09/2009  . GERD 06/19/2008  . HYPERTENSION 06/19/2008  . PARESTHESIA 06/19/2008  . PREMATURE VENTRICULAR CONTRACTIONS 12/25/2008  . Impaired glucose tolerance     History   Social History  . Marital Status: Married    Spouse Name: N/A    Number of Children: N/A  . Years of Education: N/A   Occupational History  . Teacher    Social History Main Topics  . Smoking status: Former Smoker    Quit date: 05/29/1988  . Smokeless tobacco: Never Used  . Alcohol Use: 0.5 oz/week    1 drink(s) per week  . Drug Use: Not on file  . Sexually Active: Not on file   Other Topics Concern  . 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 Esophagogastroduodenoscopy   . Tonsillectomy   . Cervical discectomy     5-6  . Esophagogastroduodenoscopy FEB 2005  . Lumbar disc surgery 2003 ([facility])  . Tonsillectomy AGE 51    Family History  Problem Relation Age of Onset  . Esophageal cancer Father   . Diabetes type II    . Cancer Maternal Grandfather     BLADDER  . Coronary artery disease Maternal Grandmother     No Known Allergies  Current Outpatient Prescriptions on File Prior to Visit  Medication Sig Dispense Refill  . benazepril (LOTENSIN) 20 MG tablet Take 20 mg by mouth daily.      . fluticasone (FLONASE) 50 MCG/ACT nasal spray Place 2 sprays into the nose daily.      Marland Kitchen loratadine (CLARITIN) 10 MG tablet Take 10 mg by mouth daily as needed. For allergies      . omeprazole  (PRILOSEC OTC) 20 MG tablet Take 20 mg by mouth daily.      . [DISCONTINUED] omeprazole (PRILOSEC OTC) 20 MG tablet Take 1 tablet (20 mg total) by mouth daily.  90 tablet  3    BP 130/90  Pulse 88  Temp 98.1 F (36.7 C) (Oral)  Resp 18  Wt 260 lb (117.935 kg)  SpO2 96%       Review of Systems  Constitutional: Negative for fever, chills, appetite change and fatigue.  HENT: Negative for hearing loss, ear pain, congestion, sore throat, trouble swallowing, neck stiffness, dental problem, voice change and tinnitus.   Eyes: Negative for pain, discharge and visual disturbance.  Respiratory: Negative for cough, chest tightness, wheezing and stridor.   Cardiovascular: Negative for chest pain, palpitations and leg swelling.  Gastrointestinal: Negative for nausea, vomiting, abdominal pain, diarrhea, constipation, blood in stool and abdominal distention.  Genitourinary: Negative for urgency, hematuria, flank pain, discharge, difficulty urinating and genital sores.  Musculoskeletal: Negative for myalgias, back pain, joint swelling, arthralgias and gait problem.  Skin: Negative for rash.  Neurological: Negative for dizziness, syncope, speech difficulty, weakness, numbness and headaches.  Hematological: Negative for adenopathy. Does not bruise/bleed easily.  Psychiatric/Behavioral: Negative for behavioral problems and dysphoric mood. The  patient is not nervous/anxious.        Objective:   Physical Exam  Constitutional: He is oriented to person, place, and time. He appears well-developed.       Repeat blood pressure 130/90 unchanged  HENT:  Head: Normocephalic.  Right Ear: External ear normal.  Left Ear: External ear normal.  Eyes: Conjunctivae normal and EOM are normal.  Neck: Normal range of motion.  Cardiovascular: Normal rate and normal heart sounds.   Pulmonary/Chest: Breath sounds normal.  Abdominal: Bowel sounds are normal.  Musculoskeletal: Normal range of motion. He exhibits no  edema and no tenderness.  Neurological: He is alert and oriented to person, place, and time.  Psychiatric: He has a normal mood and affect. His behavior is normal.          Assessment & Plan:   Hypertension Allergic rhinitis  Medications refilled CPX 6 months

## 2012-05-28 NOTE — Patient Instructions (Signed)

## 2012-09-19 ENCOUNTER — Encounter: Payer: Self-pay | Admitting: Gastroenterology

## 2012-10-02 ENCOUNTER — Encounter: Payer: Self-pay | Admitting: Gastroenterology

## 2012-11-19 ENCOUNTER — Other Ambulatory Visit: Payer: BC Managed Care – PPO

## 2012-11-22 ENCOUNTER — Other Ambulatory Visit (INDEPENDENT_AMBULATORY_CARE_PROVIDER_SITE_OTHER): Payer: BC Managed Care – PPO

## 2012-11-22 DIAGNOSIS — Z0000 Encounter for general adult medical examination without abnormal findings: Secondary | ICD-10-CM

## 2012-11-22 LAB — CBC WITH DIFFERENTIAL/PLATELET
Basophils Absolute: 0 10*3/uL (ref 0.0–0.1)
Basophils Relative: 0.6 % (ref 0.0–3.0)
Eosinophils Absolute: 0.1 10*3/uL (ref 0.0–0.7)
Eosinophils Relative: 1.9 % (ref 0.0–5.0)
HCT: 47.9 % (ref 39.0–52.0)
Hemoglobin: 16.3 g/dL (ref 13.0–17.0)
Lymphocytes Relative: 31 % (ref 12.0–46.0)
Lymphs Abs: 2.4 10*3/uL (ref 0.7–4.0)
MCHC: 34.1 g/dL (ref 30.0–36.0)
MCV: 90.7 fl (ref 78.0–100.0)
Monocytes Absolute: 0.7 10*3/uL (ref 0.1–1.0)
Monocytes Relative: 9.1 % (ref 3.0–12.0)
Neutro Abs: 4.4 10*3/uL (ref 1.4–7.7)
Neutrophils Relative %: 57.4 % (ref 43.0–77.0)
Platelets: 222 10*3/uL (ref 150.0–400.0)
RBC: 5.29 Mil/uL (ref 4.22–5.81)
RDW: 14 % (ref 11.5–14.6)
WBC: 7.7 10*3/uL (ref 4.5–10.5)

## 2012-11-22 LAB — HEPATIC FUNCTION PANEL
ALT: 40 U/L (ref 0–53)
AST: 35 U/L (ref 0–37)
Albumin: 4.4 g/dL (ref 3.5–5.2)
Alkaline Phosphatase: 69 U/L (ref 39–117)
Bilirubin, Direct: 0.1 mg/dL (ref 0.0–0.3)
Total Bilirubin: 0.4 mg/dL (ref 0.3–1.2)
Total Protein: 7.3 g/dL (ref 6.0–8.3)

## 2012-11-22 LAB — BASIC METABOLIC PANEL
BUN: 14 mg/dL (ref 6–23)
CO2: 31 mEq/L (ref 19–32)
Calcium: 9.6 mg/dL (ref 8.4–10.5)
Chloride: 103 mEq/L (ref 96–112)
Creatinine, Ser: 1.1 mg/dL (ref 0.4–1.5)
GFR: 74.63 mL/min (ref >60.00)
Glucose, Bld: 103 mg/dL — ABNORMAL HIGH (ref 70–99)
Potassium: 5 mEq/L (ref 3.5–5.1)
Sodium: 137 mEq/L (ref 135–145)

## 2012-11-22 LAB — TSH: TSH: 2.05 u[IU]/mL (ref 0.35–5.50)

## 2012-11-22 LAB — POCT URINALYSIS DIPSTICK
Bilirubin, UA: NEGATIVE
Blood, UA: NEGATIVE
Glucose, UA: NEGATIVE
Ketones, UA: NEGATIVE
Leukocytes, UA: NEGATIVE
Nitrite, UA: NEGATIVE
Protein, UA: NEGATIVE
Spec Grav, UA: 1.015
Urobilinogen, UA: 0.2
pH, UA: 6

## 2012-11-22 LAB — PSA: PSA: 0.53 ng/mL (ref 0.10–4.00)

## 2012-11-22 LAB — LIPID PANEL
Cholesterol: 196 mg/dL (ref 0–200)
HDL: 36.8 mg/dL — ABNORMAL LOW (ref >39.00)
LDL Cholesterol: 129 mg/dL — ABNORMAL HIGH (ref 0–99)
Total CHOL/HDL Ratio: 5
Triglycerides: 150 mg/dL — ABNORMAL HIGH (ref 0.0–149.0)
VLDL: 30 mg/dL (ref 0.0–40.0)

## 2012-11-26 ENCOUNTER — Ambulatory Visit (INDEPENDENT_AMBULATORY_CARE_PROVIDER_SITE_OTHER): Payer: BC Managed Care – PPO | Admitting: Internal Medicine

## 2012-11-26 ENCOUNTER — Encounter: Payer: Self-pay | Admitting: Internal Medicine

## 2012-11-26 VITALS — BP 140/90 | HR 95 | Temp 98.5°F | Resp 20 | Ht 74.0 in | Wt 257.5 lb

## 2012-11-26 DIAGNOSIS — K2270 Barrett's esophagus without dysplasia: Secondary | ICD-10-CM

## 2012-11-26 DIAGNOSIS — Z0000 Encounter for general adult medical examination without abnormal findings: Secondary | ICD-10-CM

## 2012-11-26 DIAGNOSIS — J309 Allergic rhinitis, unspecified: Secondary | ICD-10-CM

## 2012-11-26 DIAGNOSIS — I10 Essential (primary) hypertension: Secondary | ICD-10-CM

## 2012-11-26 DIAGNOSIS — K219 Gastro-esophageal reflux disease without esophagitis: Secondary | ICD-10-CM

## 2012-11-26 MED ORDER — FLUTICASONE PROPIONATE 50 MCG/ACT NA SUSP: 2.0000 | Freq: Every day | NASAL | Status: DC

## 2012-11-26 MED ORDER — BENAZEPRIL HCL 20 MG PO TABS: 20.0000 mg | ORAL_TABLET | Freq: Every day | ORAL | Status: DC

## 2012-11-26 NOTE — Patient Instructions (Signed)
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
## 2012-12-03 ENCOUNTER — Ambulatory Visit (AMBULATORY_SURGERY_CENTER): Payer: BC Managed Care – PPO

## 2012-12-03 VITALS — Ht 75.0 in | Wt 259.8 lb

## 2012-12-03 DIAGNOSIS — K2270 Barrett's esophagus without dysplasia: Secondary | ICD-10-CM

## 2012-12-04 ENCOUNTER — Encounter: Payer: Self-pay | Admitting: Gastroenterology

## 2012-12-13 ENCOUNTER — Encounter: Payer: Self-pay | Admitting: Gastroenterology

## 2012-12-13 ENCOUNTER — Ambulatory Visit (AMBULATORY_SURGERY_CENTER): Payer: BC Managed Care – PPO | Admitting: Gastroenterology

## 2012-12-13 VITALS — BP 141/74 | HR 72 | Temp 96.6°F | Resp 19 | Ht 75.0 in | Wt 259.0 lb

## 2012-12-13 DIAGNOSIS — K2270 Barrett's esophagus without dysplasia: Secondary | ICD-10-CM

## 2012-12-13 DIAGNOSIS — D131 Benign neoplasm of stomach: Secondary | ICD-10-CM

## 2012-12-13 DIAGNOSIS — K317 Polyp of stomach and duodenum: Secondary | ICD-10-CM

## 2012-12-13 MED ORDER — SODIUM CHLORIDE 0.9 % IV SOLN: 500.0000 mL | INTRAVENOUS | Status: DC

## 2012-12-13 NOTE — Progress Notes (Signed)
Patient did not have preoperative order for IV antibiotic SSI prophylaxis. (G8918)  Patient did not experience any of the following events: a burn prior to discharge; a fall within the facility; wrong site/side/patient/procedure/implant event; or a hospital transfer or hospital admission upon discharge from the facility. (G8907)  

## 2012-12-13 NOTE — Op Note (Signed)
[facility] [address] [place], [address]   ENDOSCOPY PROCEDURE REPORT  PATIENT: Phillip, Perry  MR#: [id number] BIRTHDATE: Jan 06, 1956 , 57  yrs. old GENDER: Male ENDOSCOPIST: Meryl Dare, MD, [facility] PROCEDURE DATE:  12/13/2012 PROCEDURE:  EGD w/ biopsy ASA CLASS:     Class II INDICATIONS:  follow up of Barrett's esophagus. MEDICATIONS: MAC sedation, administered by CRNA and propofol (Diprivan) 200mg  IV TOPICAL ANESTHETIC: Cetacaine Spray DESCRIPTION OF PROCEDURE: After the risks benefits and alternatives of the procedure were thoroughly explained, informed consent was obtained.  The LB [id number] endoscope was introduced through the mouth and advanced to the second portion of the duodenum without limitations.  The instrument was slowly withdrawn as the mucosa was fully examined.  ESOPHAGUS: There was evidence of Barrett's esophagus in the lower third of the esophagus.  It measured 1-2 cm. Multiple biopsies were performed.   The esophagus was otherwise normal. STOMACH: Multiple sessile polyps measuring 3-8 mm in size were found in the gastric body and gastric fundus.  Multiple biopsies were performed. The stomach otherwise appeared normal. DUODENUM: The duodenal mucosa showed no abnormalities in the bulb and second portion of the duodenum.  Retroflexed views revealed a 4 cm hiatal hernia.   The scope was then withdrawn from the patient and the procedure completed.  COMPLICATIONS: There were no complications.  ENDOSCOPIC IMPRESSION: 1.   Barrett's esophagus; multiple biopsies 2.   Hiatal hernia 3.   Multiple sessile polyps measuring 3-8 mm in the gastric body and gastric fundus  RECOMMENDATIONS: 1.  Anti-reflux regimen 2.  Await pathology results 3.  Continue PPI 4.  Repeat surveillance EGD in 3 years if no dysplasia   eSigned:  Meryl Dare, MD, [facility] 12/13/2012 9:19 AM

## 2012-12-13 NOTE — Patient Instructions (Addendum)
YOU HAD AN ENDOSCOPIC PROCEDURE TODAY AT THE [facility]: Refer to the procedure report that was given to you for any specific questions about what was found during the examination.  If the procedure report does not answer your questions, please call your gastroenterologist to clarify.  If you requested that your care partner not be given the details of your procedure findings, then the procedure report has been included in a sealed envelope for you to review at your convenience later.  YOU SHOULD EXPECT: Some feelings of bloating in the abdomen. Passage of more gas than usual.  Walking can help get rid of the air that was put into your GI tract during the procedure and reduce the bloating. DIET: Your first meal following the procedure should be a light meal and then it is ok to progress to your normal diet.  A half-sandwich or bowl of soup is an example of a good first meal.  Heavy or fried foods are harder to digest and may make you feel nauseous or bloated.  Likewise meals heavy in dairy and vegetables can cause extra gas to form and this can also increase the bloating.  Drink plenty of fluids but you should avoid alcoholic beverages for 24 hours.  ACTIVITY: Your care partner should take you home directly after the procedure.  You should plan to take it easy, moving slowly for the rest of the day.  You can resume normal activity the day after the procedure however you should NOT DRIVE or use heavy machinery for 24 hours (because of the sedation medicines used during the test).    SYMPTOMS TO REPORT IMMEDIATELY: A gastroenterologist can be reached at any hour.  During normal business hours, 8:30 AM to 5:00 PM Monday through Friday, call [phone].  After hours and on weekends, please call the GI answering service at [phone] who will take a message and have the physician on call contact you.   Following upper endoscopy (EGD)  Vomiting of blood or coffee ground material  New  chest pain or pain under the shoulder blades  Painful or persistently difficult swallowing  New shortness of breath  Fever of 100F or higher  Black, tarry-looking stools  FOLLOW UP: If any biopsies were taken you will be contacted by phone or by letter within the next 1-3 weeks.  Call your gastroenterologist if you have not heard about the biopsies in 3 weeks.  Our staff will call the home number listed on your records the next business day following your procedure to check on you and address any questions or concerns that you may have at that time regarding the information given to you following your procedure. This is a courtesy call and so if there is no answer at the home number and we have not heard from you through the emergency physician on call, we will assume that you have returned to your regular daily activities without incident.  SIGNATURES/CONFIDENTIALITY: You and/or your care partner have signed paperwork which will be entered into your electronic medical record.  These signatures attest to the fact that that the information above on your After Visit Summary has been reviewed and is understood.  Full responsibility of the confidentiality of this discharge information lies with you and/or your care-partner.

## 2012-12-16 ENCOUNTER — Telehealth: Payer: Self-pay | Admitting: *Deleted

## 2012-12-16 NOTE — Telephone Encounter (Signed)
  Follow up Call-  Call back number 12/13/2012  Post procedure Call Back phone  # [phone]  Permission to leave phone message Yes     Patient questions:  Do you have a fever, pain , or abdominal swelling? no Pain Score  0 *  Have you tolerated food without any problems? yes  Have you been able to return to your normal activities? yes  Do you have any questions about your discharge instructions: Diet   no Medications  no Follow up visit  no  Do you have questions or concerns about your Care? no  Actions: * If pain score is 4 or above: No action needed, pain <4.

## 2012-12-18 ENCOUNTER — Encounter: Payer: Self-pay | Admitting: Gastroenterology

## 2013-06-13 ENCOUNTER — Telehealth: Payer: Self-pay | Admitting: Internal Medicine

## 2013-06-13 ENCOUNTER — Other Ambulatory Visit: Payer: Self-pay | Admitting: Internal Medicine

## 2013-06-13 MED ORDER — BENAZEPRIL HCL 20 MG PO TABS: 20.0000 mg | ORAL_TABLET | Freq: Every day | ORAL | Status: DC

## 2013-06-13 MED ORDER — FLUTICASONE PROPIONATE 50 MCG/ACT NA SUSP: 2.0000 | Freq: Every day | NASAL | Status: DC

## 2013-06-13 NOTE — Telephone Encounter (Signed)
Pt states he also need a refill of:  benazepril (LOTENSIN) 20 MG tablet fluticasone (FLONASE) 50 MCG/ACT nasal spray  He needs today if possible, as he will run out of medication.  Pt thought rx was written for 1 year instead of six months.  Pharmacy- [facility].

## 2013-06-13 NOTE — Telephone Encounter (Signed)
Pt at pharm to pu med. Did not know he did not have any refills. Pt needs new rx. Pharm asked if you could write for 84 due to it comes in boxes of 42. omeprazole (PRILOSEC OTC) 20 MG tablet  [facility]

## 2013-06-13 NOTE — Telephone Encounter (Signed)
Rx sent to pharmacy   

## 2013-11-27 ENCOUNTER — Ambulatory Visit (INDEPENDENT_AMBULATORY_CARE_PROVIDER_SITE_OTHER): Payer: BC Managed Care – PPO | Admitting: Internal Medicine

## 2013-11-27 ENCOUNTER — Encounter: Payer: Self-pay | Admitting: Internal Medicine

## 2013-11-27 VITALS — BP 150/90 | HR 94 | Temp 98.8°F | Resp 20 | Ht 73.75 in | Wt 267.0 lb

## 2013-11-27 DIAGNOSIS — Z0000 Encounter for general adult medical examination without abnormal findings: Secondary | ICD-10-CM

## 2013-11-27 DIAGNOSIS — J309 Allergic rhinitis, unspecified: Secondary | ICD-10-CM

## 2013-11-27 DIAGNOSIS — I10 Essential (primary) hypertension: Secondary | ICD-10-CM

## 2013-11-27 DIAGNOSIS — E878 Other disorders of electrolyte and fluid balance, not elsewhere classified: Secondary | ICD-10-CM

## 2013-11-27 DIAGNOSIS — K2270 Barrett's esophagus without dysplasia: Secondary | ICD-10-CM

## 2013-11-27 LAB — LIPID PANEL
Cholesterol: 198 mg/dL (ref 0–200)
HDL: 33.1 mg/dL — ABNORMAL LOW (ref >39.00)
LDL Cholesterol: 116 mg/dL — ABNORMAL HIGH (ref 0–99)
NonHDL: 164.9
Total CHOL/HDL Ratio: 6
Triglycerides: 247 mg/dL — ABNORMAL HIGH (ref 0.0–149.0)
VLDL: 49.4 mg/dL — ABNORMAL HIGH (ref 0.0–40.0)

## 2013-11-27 LAB — CBC WITH DIFFERENTIAL/PLATELET
Basophils Absolute: 0 10*3/uL (ref 0.0–0.1)
Basophils Relative: 0.3 % (ref 0.0–3.0)
Eosinophils Absolute: 0.1 10*3/uL (ref 0.0–0.7)
Eosinophils Relative: 1.2 % (ref 0.0–5.0)
HCT: 48.3 % (ref 39.0–52.0)
Hemoglobin: 16.6 g/dL (ref 13.0–17.0)
Lymphocytes Relative: 30.5 % (ref 12.0–46.0)
Lymphs Abs: 2.3 10*3/uL (ref 0.7–4.0)
MCHC: 34.3 g/dL (ref 30.0–36.0)
MCV: 90.3 fl (ref 78.0–100.0)
Monocytes Absolute: 0.9 10*3/uL (ref 0.1–1.0)
Monocytes Relative: 12.7 % — ABNORMAL HIGH (ref 3.0–12.0)
Neutro Abs: 4.1 10*3/uL (ref 1.4–7.7)
Neutrophils Relative %: 55.3 % (ref 43.0–77.0)
Platelets: 236 10*3/uL (ref 150.0–400.0)
RBC: 5.34 Mil/uL (ref 4.22–5.81)
RDW: 14.3 % (ref 11.5–15.5)
WBC: 7.5 10*3/uL (ref 4.0–10.5)

## 2013-11-27 LAB — COMPREHENSIVE METABOLIC PANEL
ALT: 43 U/L (ref 0–53)
AST: 42 U/L — ABNORMAL HIGH (ref 0–37)
Albumin: 4.5 g/dL (ref 3.5–5.2)
Alkaline Phosphatase: 75 U/L (ref 39–117)
BUN: 16 mg/dL (ref 6–23)
CO2: 26 mEq/L (ref 19–32)
Calcium: 9.5 mg/dL (ref 8.4–10.5)
Chloride: 103 mEq/L (ref 96–112)
Creatinine, Ser: 1.2 mg/dL (ref 0.4–1.5)
GFR: 64.07 mL/min (ref >60.00)
Glucose, Bld: 122 mg/dL — ABNORMAL HIGH (ref 70–99)
Potassium: 4.7 mEq/L (ref 3.5–5.1)
Sodium: 138 mEq/L (ref 135–145)
Total Bilirubin: 0.9 mg/dL (ref 0.2–1.2)
Total Protein: 7.3 g/dL (ref 6.0–8.3)

## 2013-11-27 LAB — PSA: PSA: 1.04 ng/mL (ref 0.10–4.00)

## 2013-11-27 LAB — TSH: TSH: 1.8 u[IU]/mL (ref 0.35–4.50)

## 2013-11-27 MED ORDER — OMEPRAZOLE 20 MG PO TBEC: DELAYED_RELEASE_TABLET | ORAL | Status: DC

## 2013-11-27 MED ORDER — FLUTICASONE PROPIONATE 50 MCG/ACT NA SUSP: 2.0000 | Freq: Every day | NASAL | Status: DC

## 2013-11-27 MED ORDER — BENAZEPRIL HCL 20 MG PO TABS: 20.0000 mg | ORAL_TABLET | Freq: Every day | ORAL | Status: DC

## 2013-11-27 NOTE — Patient Instructions (Signed)
Brain MRI as discussed  Limit your sodium (Salt) intake  Please check your blood pressure on a regular basis.  If it is consistently greater than 150/90, please make an office appointment.    It is important that you exercise regularly, at least 20 minutes 3 to 4 times per week.  If you develop chest pain or shortness of breath seek  medical attention.  You need to lose weight.  Consider a lower calorie diet and regular exercise.

## 2013-11-27 NOTE — Progress Notes (Signed)
Pre visit review using our clinic review tool, if applicable. No additional management support is needed unless otherwise documented below in the visit note. 
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
## 2013-12-01 ENCOUNTER — Telehealth: Payer: Self-pay | Admitting: Internal Medicine

## 2013-12-01 NOTE — Telephone Encounter (Signed)
Relevant patient education assigned to patient using Emmi. ° °

## 2013-12-03 ENCOUNTER — Ambulatory Visit
Admission: RE | Admit: 2013-12-03 | Discharge: 2013-12-03 | Disposition: A | Discharge status: Home / Self Care | Payer: BC Managed Care – PPO | Source: Ambulatory Visit | Attending: Internal Medicine | Admitting: Internal Medicine

## 2013-12-03 DIAGNOSIS — E878 Other disorders of electrolyte and fluid balance, not elsewhere classified: Secondary | ICD-10-CM

## 2014-06-27 ENCOUNTER — Other Ambulatory Visit: Payer: Self-pay | Admitting: Internal Medicine

## 2014-10-27 ENCOUNTER — Telehealth: Payer: Self-pay | Admitting: Internal Medicine

## 2014-10-27 MED ORDER — FLUTICASONE PROPIONATE 50 MCG/ACT NA SUSP: 2.0000 | Freq: Every day | NASAL | Status: DC

## 2014-10-27 MED ORDER — BENAZEPRIL HCL 20 MG PO TABS: 20.0000 mg | ORAL_TABLET | Freq: Every day | ORAL | Status: DC

## 2014-10-27 NOTE — Telephone Encounter (Signed)
Patient need re-fills on benazepril (LOTENSIN) 20 MG tablet and fluticasone (FLONASE) 50 MCG/ACT nasal spray sent to [facility] [place]  He is scheduled for CPX 12/02/14.

## 2014-10-27 NOTE — Telephone Encounter (Signed)
Pt notified Rx's sent to pharmacy. 

## 2014-12-02 ENCOUNTER — Encounter: Payer: Self-pay | Admitting: Internal Medicine

## 2014-12-02 ENCOUNTER — Ambulatory Visit (INDEPENDENT_AMBULATORY_CARE_PROVIDER_SITE_OTHER): Payer: BLUE CROSS/BLUE SHIELD | Admitting: Internal Medicine

## 2014-12-02 VITALS — BP 120/90 | HR 96 | Temp 98.5°F | Resp 20 | Ht 73.5 in | Wt 275.0 lb

## 2014-12-02 DIAGNOSIS — I10 Essential (primary) hypertension: Secondary | ICD-10-CM

## 2014-12-02 DIAGNOSIS — Z0000 Encounter for general adult medical examination without abnormal findings: Secondary | ICD-10-CM

## 2014-12-02 DIAGNOSIS — K2270 Barrett's esophagus without dysplasia: Secondary | ICD-10-CM | POA: Diagnosis not present

## 2014-12-02 LAB — COMPREHENSIVE METABOLIC PANEL
ALT: 40 U/L (ref 0–53)
AST: 32 U/L (ref 0–37)
Albumin: 4.5 g/dL (ref 3.5–5.2)
Alkaline Phosphatase: 73 U/L (ref 39–117)
BUN: 17 mg/dL (ref 6–23)
CO2: 28 mEq/L (ref 19–32)
Calcium: 9.7 mg/dL (ref 8.4–10.5)
Chloride: 103 mEq/L (ref 96–112)
Creatinine, Ser: 1.13 mg/dL (ref 0.40–1.50)
GFR: 71.07 mL/min (ref >60.00)
Glucose, Bld: 106 mg/dL — ABNORMAL HIGH (ref 70–99)
Potassium: 4.9 mEq/L (ref 3.5–5.1)
Sodium: 139 mEq/L (ref 135–145)
Total Bilirubin: 0.6 mg/dL (ref 0.2–1.2)
Total Protein: 7.7 g/dL (ref 6.0–8.3)

## 2014-12-02 LAB — LIPID PANEL
Cholesterol: 190 mg/dL (ref 0–200)
HDL: 30.6 mg/dL — ABNORMAL LOW (ref >39.00)
LDL Cholesterol: 120 mg/dL — ABNORMAL HIGH (ref 0–99)
NonHDL: 159.4
Total CHOL/HDL Ratio: 6
Triglycerides: 198 mg/dL — ABNORMAL HIGH (ref 0.0–149.0)
VLDL: 39.6 mg/dL (ref 0.0–40.0)

## 2014-12-02 LAB — TSH: TSH: 2.39 u[IU]/mL (ref 0.35–4.50)

## 2014-12-02 MED ORDER — FLUTICASONE PROPIONATE 50 MCG/ACT NA SUSP: 2.0000 | Freq: Every day | NASAL | Status: DC

## 2014-12-02 MED ORDER — OMEPRAZOLE 20 MG PO CPDR: 20.0000 mg | DELAYED_RELEASE_CAPSULE | Freq: Every day | ORAL | Status: DC

## 2014-12-02 MED ORDER — BENAZEPRIL HCL 20 MG PO TABS: 20.0000 mg | ORAL_TABLET | Freq: Every day | ORAL | Status: DC

## 2014-12-02 NOTE — Progress Notes (Signed)
Pre visit review using our clinic review tool, if applicable. No additional management support is needed unless otherwise documented below in the visit note. 

## 2014-12-02 NOTE — Patient Instructions (Addendum)
Limit your sodium (Salt) intake    It is important that you exercise regularly, at least 20 minutes 3 to 4 times per week.  If you develop chest pain or shortness of breath seek  medical attention.  You need to lose weight.  Consider a lower calorie diet and regular exercise.  See ophthalmology for evaluation of double vision.  Please call for neurology referral if ophthalmology evaluation is inconclusive  Please check your blood pressure on a regular basis.  If it is consistently greater than 150/90, please make an office appointment.   Health Maintenance A healthy lifestyle and preventative care can promote health and wellness.  Maintain regular health, dental, and eye exams.  Eat a healthy diet. Foods like vegetables, fruits, whole grains, low-fat dairy products, and lean protein foods contain the nutrients you need and are low in calories. Decrease your intake of foods high in solid fats, added sugars, and salt. Get information about a proper diet from your health care provider, if necessary.  Regular physical exercise is one of the most important things you can do for your health. Most adults should get at least 150 minutes of moderate-intensity exercise (any activity that increases your heart rate and causes you to sweat) each week. In addition, most adults need muscle-strengthening exercises on 2 or more days a week.   Maintain a healthy weight. The body mass index (BMI) is a screening tool to identify possible weight problems. It provides an estimate of body fat based on height and weight. Your health care provider can find your BMI and can help you achieve or maintain a healthy weight. For males 20 years and older:  A BMI below 18.5 is considered underweight.  A BMI of 18.5 to 24.9 is normal.  A BMI of 25 to 29.9 is considered overweight.  A BMI of 30 and above is considered obese.  Maintain normal blood lipids and cholesterol by exercising and minimizing your intake of  saturated fat. Eat a balanced diet with plenty of fruits and vegetables. Blood tests for lipids and cholesterol should begin at age 7 and be repeated every 5 years. If your lipid or cholesterol levels are high, you are over age 31, or you are at high risk for heart disease, you may need your cholesterol levels checked more frequently.Ongoing high lipid and cholesterol levels should be treated with medicines if diet and exercise are not working.  If you smoke, find out from your health care provider how to quit. If you do not use tobacco, do not start.  Lung cancer screening is recommended for adults aged 56-80 years who are at high risk for developing lung cancer because of a history of smoking. A yearly low-dose CT scan of the lungs is recommended for people who have at least a 30-pack-year history of smoking and are current smokers or have quit within the past 15 years. A pack year of smoking is smoking an average of 1 pack of cigarettes a day for 1 year (for example, a 30-pack-year history of smoking could mean smoking 1 pack a day for 30 years or 2 packs a day for 15 years). Yearly screening should continue until the smoker has stopped smoking for at least 15 years. Yearly screening should be stopped for people who develop a health problem that would prevent them from having lung cancer treatment.  If you choose to drink alcohol, do not have more than 2 drinks per day. One drink is considered to be 12 oz (  360 mL) of beer, 5 oz (150 mL) of wine, or 1.5 oz (45 mL) of liquor.  Avoid the use of street drugs. Do not share needles with anyone. Ask for help if you need support or instructions about stopping the use of drugs.  High blood pressure causes heart disease and increases the risk of stroke. Blood pressure should be checked at least every 1-2 years. Ongoing high blood pressure should be treated with medicines if weight loss and exercise are not effective.  If you are 37-33 years old, ask your  health care provider if you should take aspirin to prevent heart disease.  Diabetes screening involves taking a blood sample to check your fasting blood sugar level. This should be done once every 3 years after age 67 if you are at a normal weight and without risk factors for diabetes. Testing should be considered at a younger age or be carried out more frequently if you are overweight and have at least 1 risk factor for diabetes.  Colorectal cancer can be detected and often prevented. Most routine colorectal cancer screening begins at the age of 38 and continues through age 37. However, your health care provider may recommend screening at an earlier age if you have risk factors for colon cancer. On a yearly basis, your health care provider may provide home test kits to check for hidden blood in the stool. A small camera at the end of a tube may be used to directly examine the colon (sigmoidoscopy or colonoscopy) to detect the earliest forms of colorectal cancer. Talk to your health care provider about this at age 32 when routine screening begins. A direct exam of the colon should be repeated every 5-10 years through age 30, unless early forms of precancerous polyps or small growths are found.  People who are at an increased risk for hepatitis B should be screened for this virus. You are considered at high risk for hepatitis B if:  You were born in a country where hepatitis B occurs often. Talk with your health care provider about which countries are considered high risk.  Your parents were born in a high-risk country and you have not received a shot to protect against hepatitis B (hepatitis B vaccine).  You have HIV or AIDS.  You use needles to inject street drugs.  You live with, or have sex with, someone who has hepatitis B.  You are a man who has sex with other men (MSM).  You get hemodialysis treatment.  You take certain medicines for conditions like cancer, organ transplantation, and  autoimmune conditions.  Hepatitis C blood testing is recommended for all people born from [birth year] through 1963 and any individual with known risk factors for hepatitis C.  Healthy men should no longer receive prostate-specific antigen (PSA) blood tests as part of routine cancer screening. Talk to your health care provider about prostate cancer screening.  Testicular cancer screening is not recommended for adolescents or adult males who have no symptoms. Screening includes self-exam, a health care provider exam, and other screening tests. Consult with your health care provider about any symptoms you have or any concerns you have about testicular cancer.  Practice safe sex. Use condoms and avoid high-risk sexual practices to reduce the spread of sexually transmitted infections (STIs).  You should be screened for STIs, including gonorrhea and chlamydia if:  You are sexually active and are younger than 24 years.  You are older than 24 years, and your health care provider tells you  that you are at risk for this type of infection.  Your sexual activity has changed since you were last screened, and you are at an increased risk for chlamydia or gonorrhea. Ask your health care provider if you are at risk.  If you are at risk of being infected with HIV, it is recommended that you take a prescription medicine daily to prevent HIV infection. This is called pre-exposure prophylaxis (PrEP). You are considered at risk if:  You are a man who has sex with other men (MSM).  You are a heterosexual man who is sexually active with multiple partners.  You take drugs by injection.  You are sexually active with a partner who has HIV.  Talk with your health care provider about whether you are at high risk of being infected with HIV. If you choose to begin PrEP, you should first be tested for HIV. You should then be tested every 3 months for as long as you are taking PrEP.  Use sunscreen. Apply sunscreen liberally  and repeatedly throughout the day. You should seek shade when your shadow is shorter than you. Protect yourself by wearing long sleeves, pants, a wide-brimmed hat, and sunglasses year round whenever you are outdoors.  Tell your health care provider of new moles or changes in moles, especially if there is a change in shape or color. Also, tell your health care provider if a mole is larger than the size of a pencil eraser.  A one-time screening for abdominal aortic aneurysm (AAA) and surgical repair of large AAAs by ultrasound is recommended for men aged 44-75 years who are current or former smokers.  Stay current with your vaccines (immunizations). Document Released: 11/11/2007 Document Revised: 05/20/2013 Document Reviewed: 10/10/2010 [facility] [employer] Patient Information 2015 [place], [place]. This information is not intended to replace advice given to you by your health care provider. Make sure you discuss any questions you have with your health care provider.
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
## 2014-12-03 LAB — CBC WITH DIFFERENTIAL/PLATELET
Basophils Absolute: 0 10*3/uL (ref 0.0–0.1)
Basophils Relative: 0.6 % (ref 0.0–3.0)
Eosinophils Absolute: 0.1 10*3/uL (ref 0.0–0.7)
Eosinophils Relative: 1.8 % (ref 0.0–5.0)
HCT: 50.7 % (ref 39.0–52.0)
Hemoglobin: 17.1 g/dL — ABNORMAL HIGH (ref 13.0–17.0)
Lymphocytes Relative: 33.7 % (ref 12.0–46.0)
Lymphs Abs: 2.5 10*3/uL (ref 0.7–4.0)
MCHC: 33.7 g/dL (ref 30.0–36.0)
MCV: 91.4 fl (ref 78.0–100.0)
Monocytes Absolute: 0.6 10*3/uL (ref 0.1–1.0)
Monocytes Relative: 8.9 % (ref 3.0–12.0)
Neutro Abs: 4 10*3/uL (ref 1.4–7.7)
Neutrophils Relative %: 55 % (ref 43.0–77.0)
Platelets: 220 10*3/uL (ref 150.0–400.0)
RBC: 5.55 Mil/uL (ref 4.22–5.81)
RDW: 14.3 % (ref 11.5–15.5)
WBC: 7.3 10*3/uL (ref 4.0–10.5)

## 2015-02-19 ENCOUNTER — Encounter: Payer: Self-pay | Admitting: Gastroenterology

## 2015-08-09 ENCOUNTER — Other Ambulatory Visit: Payer: Self-pay | Admitting: Oral Surgery

## 2015-08-19 ENCOUNTER — Telehealth: Payer: Self-pay | Admitting: Internal Medicine

## 2015-08-19 DIAGNOSIS — R42 Dizziness and giddiness: Secondary | ICD-10-CM

## 2015-08-19 DIAGNOSIS — R2689 Other abnormalities of gait and mobility: Secondary | ICD-10-CM

## 2015-08-19 NOTE — Telephone Encounter (Signed)
Okay to for referral to Neurologist?

## 2015-08-19 NOTE — Telephone Encounter (Signed)
Pt states he went to the eye dr as Dr Raliegh Ip suggested, and eye dr states there is nothing wrong.next step is neurologist. Pt has an equilibrium issue.  Pt would like a referral to a neurologist.

## 2015-08-20 NOTE — Telephone Encounter (Signed)
Spoke to pt, told him I put order in for Neurology referral and someone will be contacting you to schedule an appt. Pt verbalized understanding.

## 2015-08-20 NOTE — Telephone Encounter (Signed)
Left message on voicemail to call office. Order for referral to Neurology was done.

## 2015-08-20 NOTE — Telephone Encounter (Signed)
Ok for referral?

## 2015-08-30 ENCOUNTER — Ambulatory Visit (INDEPENDENT_AMBULATORY_CARE_PROVIDER_SITE_OTHER): Payer: BLUE CROSS/BLUE SHIELD | Admitting: Neurology

## 2015-08-30 ENCOUNTER — Other Ambulatory Visit: Payer: BLUE CROSS/BLUE SHIELD

## 2015-08-30 ENCOUNTER — Encounter: Payer: Self-pay | Admitting: Neurology

## 2015-08-30 VITALS — BP 130/90 | HR 92 | Ht 75.0 in | Wt 266.0 lb

## 2015-08-30 DIAGNOSIS — H02401 Unspecified ptosis of right eyelid: Secondary | ICD-10-CM

## 2015-08-30 DIAGNOSIS — H532 Diplopia: Secondary | ICD-10-CM | POA: Diagnosis not present

## 2015-08-30 DIAGNOSIS — R9089 Other abnormal findings on diagnostic imaging of central nervous system: Secondary | ICD-10-CM

## 2015-08-30 DIAGNOSIS — R270 Ataxia, unspecified: Secondary | ICD-10-CM

## 2015-08-30 DIAGNOSIS — R930 Abnormal findings on diagnostic imaging of skull and head, not elsewhere classified: Secondary | ICD-10-CM | POA: Diagnosis not present
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
## 2015-08-30 NOTE — Patient Instructions (Addendum)
We will do lab testing to screen for myasthenia gravis.  This is not a perfectly sensitive test and we may need other tests to look for this.  I will also do a contrasted MRI brain and MRA brain to make sure that we are not dealing with aneurysm.  1. Your provider has requested that you have labwork completed today. Please go to [facility] ([id number]) on the second floor of this building before leaving the office today. You do not need to check in. If you are not called within 15 minutes please check with the front desk.   2. We have scheduled you at [facility] for your MRI Brain and MRA head on 09/08/2015 at 12:00 pm. Please arrive 15 minutes prior and go to 1st floor radiology. If you need to reschedule for any reason please call [phone].

## 2015-09-04 LAB — MYASTHENIA GRAVIS PANEL 2
Acetylcholine Rec Binding: <0.3 nmol/L
Acetylcholine Rec Mod Ab: 18 % binding inhibition
Aceytlcholine Rec Bloc Ab: <15 % of inhibition (ref <15)

## 2015-09-06 ENCOUNTER — Telehealth: Payer: Self-pay | Admitting: Neurology

## 2015-09-06 NOTE — Telephone Encounter (Signed)
Left message on machine for patient to call back.

## 2015-09-06 NOTE — Telephone Encounter (Signed)
-----   Message from [place], DO sent at 09/06/2015  7:40 AM EDT ----- Let pt know that labs okay. Check MUSK Ab

## 2015-09-07 NOTE — Telephone Encounter (Signed)
Patient made aware labs normal and need athena testing. He will come by the office tomorrow to sign paperwork.

## 2015-09-08 ENCOUNTER — Ambulatory Visit (HOSPITAL_COMMUNITY)
Admission: RE | Admit: 2015-09-08 | Discharge: 2015-09-08 | Disposition: A | Discharge status: Home / Self Care | Payer: BLUE CROSS/BLUE SHIELD | Source: Ambulatory Visit | Attending: Neurology | Admitting: Neurology

## 2015-09-08 DIAGNOSIS — H532 Diplopia: Secondary | ICD-10-CM

## 2015-09-08 DIAGNOSIS — R270 Ataxia, unspecified: Secondary | ICD-10-CM | POA: Insufficient documentation

## 2015-09-08 DIAGNOSIS — G9389 Other specified disorders of brain: Secondary | ICD-10-CM | POA: Diagnosis not present

## 2015-09-08 DIAGNOSIS — H02401 Unspecified ptosis of right eyelid: Secondary | ICD-10-CM

## 2015-09-08 DIAGNOSIS — R9089 Other abnormal findings on diagnostic imaging of central nervous system: Secondary | ICD-10-CM

## 2015-09-08 LAB — CREATININE, SERUM
Creatinine, Ser: 1.11 mg/dL (ref 0.61–1.24)
GFR calc Af Amer: >60 mL/min (ref >60)
GFR calc non Af Amer: >60 mL/min (ref >60)

## 2015-09-08 MED ORDER — GADOBENATE DIMEGLUMINE 529 MG/ML IV SOLN: 20.0000 mL | Freq: Once | INTRAVENOUS | Status: DC | PRN

## 2015-09-09 ENCOUNTER — Telehealth: Payer: Self-pay | Admitting: Neurology

## 2015-09-09 NOTE — Telephone Encounter (Signed)
Mychart message sent to patient.

## 2015-09-09 NOTE — Telephone Encounter (Signed)
-----   Message from [place], DO sent at 09/09/2015  8:12 AM EDT ----- Let pt know that MRI/MRA looks good

## 2015-09-20 DIAGNOSIS — H02401 Unspecified ptosis of right eyelid: Secondary | ICD-10-CM | POA: Diagnosis not present

## 2015-09-20 DIAGNOSIS — H532 Diplopia: Secondary | ICD-10-CM | POA: Diagnosis not present

## 2015-10-04 ENCOUNTER — Encounter: Payer: Self-pay | Admitting: Gastroenterology

## 2015-10-12 ENCOUNTER — Telehealth: Payer: Self-pay | Admitting: Neurology

## 2015-10-12 NOTE — Telephone Encounter (Signed)
Let pt know that [facility] testing for MuSK was negative.  I would like him to see neuromuscular specialist to see if he/she has other thoughts.  Dr. Posey Pronto is willing, but may end up sending to [facility] for specialized testing.  Which does he prefer?

## 2015-10-12 NOTE — Telephone Encounter (Signed)
Left message on machine for patient to call back.

## 2015-10-13 NOTE — Telephone Encounter (Signed)
Patient made aware. He preferred appt with Dr Posey Pronto. Appt made.

## 2015-10-27 ENCOUNTER — Encounter: Payer: Self-pay | Admitting: Neurology

## 2015-11-17 ENCOUNTER — Encounter: Payer: Self-pay | Admitting: Gastroenterology

## 2015-11-18 ENCOUNTER — Other Ambulatory Visit: Payer: Self-pay | Admitting: *Deleted

## 2015-11-18 MED ORDER — FLUTICASONE PROPIONATE 50 MCG/ACT NA SUSP: 2.0000 | Freq: Every day | NASAL | Status: DC

## 2015-11-18 NOTE — Telephone Encounter (Signed)
Recieved fax refill request for Fluticansone

## 2015-11-24 ENCOUNTER — Other Ambulatory Visit (INDEPENDENT_AMBULATORY_CARE_PROVIDER_SITE_OTHER): Payer: BLUE CROSS/BLUE SHIELD

## 2015-11-24 ENCOUNTER — Encounter: Payer: Self-pay | Admitting: Neurology

## 2015-11-24 ENCOUNTER — Ambulatory Visit (INDEPENDENT_AMBULATORY_CARE_PROVIDER_SITE_OTHER): Payer: BLUE CROSS/BLUE SHIELD | Admitting: Neurology

## 2015-11-24 VITALS — BP 140/90 | HR 99 | Ht 75.0 in | Wt 274.0 lb

## 2015-11-24 DIAGNOSIS — R930 Abnormal findings on diagnostic imaging of skull and head, not elsewhere classified: Secondary | ICD-10-CM

## 2015-11-24 DIAGNOSIS — H532 Diplopia: Secondary | ICD-10-CM | POA: Diagnosis not present

## 2015-11-24 DIAGNOSIS — H02401 Unspecified ptosis of right eyelid: Secondary | ICD-10-CM | POA: Diagnosis not present

## 2015-11-24 DIAGNOSIS — R9089 Other abnormal findings on diagnostic imaging of central nervous system: Secondary | ICD-10-CM

## 2015-11-24 DIAGNOSIS — R270 Ataxia, unspecified: Secondary | ICD-10-CM

## 2015-11-24 DIAGNOSIS — G609 Hereditary and idiopathic neuropathy, unspecified: Secondary | ICD-10-CM

## 2015-11-24 LAB — FOLATE: Folate: >23.7 ng/mL (ref >5.9)

## 2015-11-24 LAB — VITAMIN B12: Vitamin B-12: 272 pg/mL (ref 211–911)
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
## 2015-11-26 LAB — PROTEIN ELECTROPHORESIS, SERUM
Albumin ELP: 4.5 g/dL (ref 3.8–4.8)
Alpha-1-Globulin: 0.3 g/dL (ref 0.2–0.3)
Alpha-2-Globulin: 0.7 g/dL (ref 0.5–0.9)
Beta 2: 0.3 g/dL (ref 0.2–0.5)
Beta Globulin: 0.4 g/dL (ref 0.4–0.6)
Gamma Globulin: 1.1 g/dL (ref 0.8–1.7)
Total Protein, Serum Electrophoresis: 7.3 g/dL (ref 6.1–8.1)

## 2015-11-26 LAB — METHYLMALONIC ACID, SERUM: Methylmalonic Acid, Quant: 131 nmol/L (ref 87–318)

## 2015-11-26 LAB — ZINC: Zinc: 61 ug/dL (ref 60–130)

## 2015-11-26 LAB — IMMUNOFIXATION ELECTROPHORESIS
IgA: 65 mg/dL — ABNORMAL LOW (ref 81–463)
IgG (Immunoglobin G), Serum: 1208 mg/dL (ref 694–1618)
IgM, Serum: 58 mg/dL (ref 48–271)

## 2015-11-26 LAB — CERULOPLASMIN: Ceruloplasmin: 26 mg/dL (ref 18–36)

## 2015-11-29 ENCOUNTER — Ambulatory Visit (INDEPENDENT_AMBULATORY_CARE_PROVIDER_SITE_OTHER): Payer: BLUE CROSS/BLUE SHIELD | Admitting: Neurology

## 2015-11-29 DIAGNOSIS — M5412 Radiculopathy, cervical region: Secondary | ICD-10-CM

## 2015-11-29 DIAGNOSIS — H02401 Unspecified ptosis of right eyelid: Secondary | ICD-10-CM

## 2015-11-29 DIAGNOSIS — H532 Diplopia: Secondary | ICD-10-CM

## 2015-11-29 DIAGNOSIS — R9089 Other abnormal findings on diagnostic imaging of central nervous system: Secondary | ICD-10-CM

## 2015-11-29 DIAGNOSIS — R270 Ataxia, unspecified: Secondary | ICD-10-CM

## 2015-11-29 LAB — VITAMIN B1: Vitamin B1 (Thiamine): 11 nmol/L (ref 8–30)

## 2015-11-29 NOTE — Procedures (Signed)
[facility] Neurology  [address]  [place], [place] [address] Tel: [phone] Fax:  [phone] Test Date:  11/29/2015  Patient: Phillip Perry DOB: 09/07/1955 Physician: Narda Amber, DO  Sex: Male Height: 6\' 3"  Ref Phys: Narda Amber, DO  ID#: [id number] Temp: 33.5C Technician: Jerilynn Mages. Dean   Patient Complaints: This is a 60 year old gentleman referred for evaluation of double vision and gait imbalance.  NCV & EMG Findings: Extensive electrodiagnostic testing of the right upper and lower extremities shows: 1. Right median, sural, and superficial peroneal sensory responses are within normal limits. 2. Right median, peroneal, and tibial motor responses are within normal limits. 3. Repetitive nerve stimulation of the spinal accessory, median, and peroneal nerves recording at the trapezius, abductor pollicis brevis, and anterior tibialis muscles, respectively, are within normal limits. 4. Sparse chronic motor axon loss changes are seen affecting the C6 myotomes on the right, without accompanied active denervation.  Impression: 1. Chronic C6 radiculopathy affecting the right upper extremity, mild in degree electrically. 2. There is no evidence of a neuromuscular junction disorder or large fiber sensorimotor polyneuropathy.    ___________________________ Narda Amber, DO    Nerve Conduction Studies Anti Sensory Summary Table   Stim Site NR Peak (ms) Norm Peak (ms) P-T Amp (V) Norm P-T Amp  Right Median Anti Sensory (2nd Digit)  33.5C  Wrist    3.4 <3.6 15.5 >15  Right Sup Peroneal Anti Sensory (Ant Lat Mall)  33.5C  12 cm    3.5 <4.6 8.8 >4  Right Sural Anti Sensory (Lat Mall)  33.5C  Calf    4.1 <4.6 4.1 >4   Motor Summary Table   Stim Site NR Onset (ms) Norm Onset (ms) O-P Amp (mV) Norm O-P Amp Site1 Site2 Delta-0 (ms) Dist (cm) Vel (m/s) Norm Vel (m/s)  Right Median Motor (Abd Poll Brev)  33.5C  Wrist    3.4 <4.0 10.3 >6 Elbow Wrist 4.6 28.0 61 >50  Elbow     8.0  9.8         Right Peroneal Motor (Ext Dig Brev)  33.5C  Ankle    4.3 <6.0 3.1 >2.5 B Fib Ankle 8.2 36.0 44 >40  B Fib    12.5  2.7  Poplt B Fib 2.1 10.0 48 >40  Poplt    14.6  2.7         Right Peroneal TA Motor (Tib Ant)  33.5C  Fib Head    3.5 <4.5 5.5 >3 Poplit Fib Head 1.7 10.0 59 >40  Poplit    5.2  5.4         Right Tibial Motor (Abd Hall Brev)  33.5C  Ankle    4.1 <6.0 6.3 >4 Knee Ankle 10.7 45.0 42 >40  Knee    14.8  4.0          EMG   Side Muscle Ins Act Fibs Psw Fasc Number Recrt Dur Dur. Amp Amp. Poly Poly. Comment  Right 1stDorInt Nml Nml Nml Nml Nml Nml Nml Nml Nml Nml Nml Nml N/A  Right AntTibialis Nml Nml Nml Nml Nml Nml Nml Nml Nml Nml Nml Nml N/A  Right Gastroc Nml Nml Nml Nml Nml Nml Nml Nml Nml Nml Nml Nml N/A  Right RectFemoris Nml Nml Nml Nml Nml Nml Nml Nml Nml Nml Nml Nml N/A  Right GluteusMed Nml Nml Nml Nml Nml Nml Nml Nml Nml Nml Nml Nml N/A  Right PronatorTeres Nml Nml Nml Nml 1- Mod-R Few  1+ Few 1+ Nml Nml N/A  Right Biceps Nml Nml Nml Nml 1- Rapid Few 1+ Few 1+ Nml Nml N/A  Right Triceps Nml Nml Nml Nml Nml Nml Nml Nml Nml Nml Nml Nml N/A  Right Deltoid Nml Nml Nml Nml Nml Nml Nml Nml Nml Nml Nml Nml N/A  Right Ext Indicis Nml Nml Nml Nml Nml Nml Nml Nml Nml Nml Nml Nml N/A  Right Flex Dig Long Nml Nml Nml Nml Nml Nml Nml Nml Nml Nml Nml Nml N/A   RNS   Trial # Label Amp 1 (mV)  O-P Amp 5 (mV)  O-P Amp % Dif Area 1 (mVms) Area 5 (mVms) Area % Dif Rep Rate Train Length Pause Time (min:sec) Comments  Right AntTibialis  Tr 1 Baseline 5.23 5.33 2.0 28.30 25.42 -10.2 3.00 10 00:30   Tr 2 Post Exercise 5.37 5.47 1.9 25.32 25.41 0.3 3.00 10 01:00   Tr 3 1 Min Post 5.56 5.50 -1.0 31.49 30.10 -4.4 3.00 10 01:00   Tr 4 2 Min Post 5.35 5.47 2.2 29.44 26.22 -10.9 3.00 10 01:00   Tr 5 3 Min Post 5.26 5.47 3.9 25.04 25.96 3.7 3.00 10 00:00   Right Abd Poll Brev  Tr 1 Baseline 10.41 10.20 -2.0 29.88 27.75 -7.1 3.00 10 00:30   Tr 2 Post Exercise 10.49  10.47 -0.2 29.66 27.88 -6.0 3.00 10 01:00   Tr 3 1 Min Post 10.63 10.45 -1.7 29.12 27.30 -6.2 3.00 10 01:00   Tr 4 2 Min Post 10.73 10.50 -2.1 29.21 27.22 -6.8 3.00 10 01:00   Tr 5 3 Min Post 10.75 10.62 -1.2 29.32 27.82 -5.1 3.00 10 00:00   Right Trapezius  Tr 1 Baseline 1.06 1.10 4.1 6.15 6.10 -1.0 3.00 10 00:30   Tr 2 Post Exercise 0.76 0.88 14.4 3.93 4.30 9.6 3.00 10 01:00   Tr 3 1 Min Post 1.48 1.68 13.2 8.32 9.28 11.5 3.00 10 01:00   Tr 7 2 Min Post 1.98 1.99 0.0 12.42 12.02 -3.2 3.00 10 01:00   Tr 8 3 Min Post 1.66 1.74 5.2 10.15 10.54 3.8 3.00 10 00:00   Act        3.00 10 00:00              Waveforms:

## 2015-12-02 ENCOUNTER — Encounter: Payer: Self-pay | Admitting: *Deleted

## 2015-12-02 ENCOUNTER — Ambulatory Visit (AMBULATORY_SURGERY_CENTER): Payer: Self-pay | Admitting: *Deleted

## 2015-12-02 VITALS — Ht 75.0 in | Wt 271.8 lb

## 2015-12-02 DIAGNOSIS — K2270 Barrett's esophagus without dysplasia: Secondary | ICD-10-CM

## 2015-12-02 NOTE — Progress Notes (Signed)
No egg or soy allergy known to patient  issues with past sedation with any surgeries  or procedures of post op n/v, no intubation problems  No diet pills per patient No home 02 use per patient  No blood thinners per patient  Pt denies issues with constipation  emmi declined

## 2015-12-07 ENCOUNTER — Other Ambulatory Visit: Payer: BLUE CROSS/BLUE SHIELD

## 2015-12-07 DIAGNOSIS — H532 Diplopia: Secondary | ICD-10-CM

## 2015-12-07 DIAGNOSIS — R9089 Other abnormal findings on diagnostic imaging of central nervous system: Secondary | ICD-10-CM

## 2015-12-07 DIAGNOSIS — R270 Ataxia, unspecified: Secondary | ICD-10-CM

## 2015-12-07 DIAGNOSIS — H02401 Unspecified ptosis of right eyelid: Secondary | ICD-10-CM

## 2015-12-07 LAB — GLUCOSE TOLERANCE, 2 HOURS
Glucose, 1 Hour GTT: 230 mg/dL
Glucose, 2 hour: 176 mg/dL
Glucose, Fasting: 114 mg/dL — ABNORMAL HIGH (ref 70–99)

## 2015-12-15 ENCOUNTER — Encounter: Payer: BLUE CROSS/BLUE SHIELD | Admitting: Gastroenterology

## 2015-12-20 ENCOUNTER — Other Ambulatory Visit: Payer: Self-pay | Admitting: *Deleted

## 2015-12-20 ENCOUNTER — Other Ambulatory Visit: Payer: Self-pay | Admitting: Internal Medicine

## 2015-12-20 MED ORDER — BENAZEPRIL HCL 20 MG PO TABS: 20.0000 mg | ORAL_TABLET | Freq: Every day | ORAL | 0 refills | Status: DC

## 2015-12-20 MED ORDER — OMEPRAZOLE 20 MG PO CPDR: 20.0000 mg | DELAYED_RELEASE_CAPSULE | Freq: Every day | ORAL | 0 refills | Status: DC

## 2015-12-20 NOTE — Telephone Encounter (Signed)
Pt needs refill on omeprazole and benazepril #30 each w/refills send to [facility]

## 2015-12-20 NOTE — Telephone Encounter (Signed)
Sent rxs into pharmacy where patient was waiting

## 2015-12-21 ENCOUNTER — Encounter: Payer: Self-pay | Admitting: Neurology

## 2015-12-21 ENCOUNTER — Ambulatory Visit (INDEPENDENT_AMBULATORY_CARE_PROVIDER_SITE_OTHER): Payer: BLUE CROSS/BLUE SHIELD | Admitting: Neurology

## 2015-12-21 VITALS — BP 110/70 | HR 91 | Ht 75.0 in | Wt 264.4 lb

## 2015-12-21 DIAGNOSIS — R270 Ataxia, unspecified: Secondary | ICD-10-CM

## 2015-12-21 DIAGNOSIS — R269 Unspecified abnormalities of gait and mobility: Secondary | ICD-10-CM | POA: Diagnosis not present

## 2015-12-21 DIAGNOSIS — R7302 Impaired glucose tolerance (oral): Secondary | ICD-10-CM

## 2015-12-21 DIAGNOSIS — E538 Deficiency of other specified B group vitamins: Secondary | ICD-10-CM | POA: Diagnosis not present

## 2015-12-21 DIAGNOSIS — H532 Diplopia: Secondary | ICD-10-CM

## 2015-12-21 MED ORDER — PYRIDOSTIGMINE BROMIDE 60 MG PO TABS: 60.0000 mg | ORAL_TABLET | Freq: Two times a day (BID) | ORAL | 0 refills | Status: DC | PRN
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
## 2015-12-21 NOTE — Patient Instructions (Addendum)
1.  Start mestinon 60mg  as needed as a trial to see if this helps with your double vision 2.  Start physical therapy for gait training 3.  Start vitamin B12 1000mg  daily 4.  Because your sugars are elevated, limit your sweets and carbohydrates  Return to clinic in 4 months

## 2015-12-22 DIAGNOSIS — R531 Weakness: Secondary | ICD-10-CM | POA: Diagnosis not present

## 2015-12-29 DIAGNOSIS — R531 Weakness: Secondary | ICD-10-CM | POA: Diagnosis not present

## 2015-12-31 ENCOUNTER — Encounter: Payer: Self-pay | Admitting: Gastroenterology

## 2015-12-31 MED ORDER — BENAZEPRIL HCL 20 MG PO TABS: 20.0000 mg | ORAL_TABLET | Freq: Every day | ORAL | 1 refills | Status: DC

## 2015-12-31 MED ORDER — OMEPRAZOLE 20 MG PO CPDR: 20.0000 mg | DELAYED_RELEASE_CAPSULE | Freq: Every day | ORAL | 1 refills | Status: DC

## 2015-12-31 NOTE — Telephone Encounter (Signed)
Rx's sent in. °

## 2016-01-05 DIAGNOSIS — R531 Weakness: Secondary | ICD-10-CM | POA: Diagnosis not present

## 2016-01-12 DIAGNOSIS — R531 Weakness: Secondary | ICD-10-CM | POA: Diagnosis not present

## 2016-01-14 ENCOUNTER — Ambulatory Visit (AMBULATORY_SURGERY_CENTER): Payer: BLUE CROSS/BLUE SHIELD | Admitting: Gastroenterology

## 2016-01-14 ENCOUNTER — Encounter: Payer: Self-pay | Admitting: Gastroenterology

## 2016-01-14 VITALS — BP 126/94 | HR 72 | Temp 97.8°F | Resp 13 | Ht 75.0 in | Wt 271.0 lb

## 2016-01-14 DIAGNOSIS — K2270 Barrett's esophagus without dysplasia: Secondary | ICD-10-CM

## 2016-01-14 DIAGNOSIS — K219 Gastro-esophageal reflux disease without esophagitis: Secondary | ICD-10-CM | POA: Diagnosis not present

## 2016-01-14 MED ORDER — SODIUM CHLORIDE 0.9 % IV SOLN: 500.0000 mL | INTRAVENOUS | Status: DC

## 2016-01-14 NOTE — Progress Notes (Signed)
Report to PACU, RN, vss, BBS= Clear.  

## 2016-01-14 NOTE — Patient Instructions (Signed)
YOU HAD AN ENDOSCOPIC PROCEDURE TODAY AT [facility]:   Refer to the procedure report that was given to you for any specific questions about what was found during the examination.  If the procedure report does not answer your questions, please call your gastroenterologist to clarify.  If you requested that your care partner not be given the details of your procedure findings, then the procedure report has been included in a sealed envelope for you to review at your convenience later.  YOU SHOULD EXPECT: Some feelings of bloating in the abdomen. Passage of more gas than usual.  Walking can help get rid of the air that was put into your GI tract during the procedure and reduce the bloating. If you had a lower endoscopy (such as a colonoscopy or flexible sigmoidoscopy) you may notice spotting of blood in your stool or on the toilet paper. If you underwent a bowel prep for your procedure, you may not have a normal bowel movement for a few days.  Please Note:  You might notice some irritation and congestion in your nose or some drainage.  This is from the oxygen used during your procedure.  There is no need for concern and it should clear up in a day or so.  SYMPTOMS TO REPORT IMMEDIATELY:   Following lower endoscopy (colonoscopy or flexible sigmoidoscopy):  Excessive amounts of blood in the stool  Significant tenderness or worsening of abdominal pains  Swelling of the abdomen that is new, acute  Fever of 100F or higher   Following upper endoscopy (EGD)  Vomiting of blood or coffee ground material  New chest pain or pain under the shoulder blades  Painful or persistently difficult swallowing  New shortness of breath  Fever of 100F or higher  Black, tarry-looking stools  For urgent or emergent issues, a gastroenterologist can be reached at any hour by calling [phone].   DIET:  We do recommend a small meal at first, but then you may proceed to your regular diet.  Drink  plenty of fluids but you should avoid alcoholic beverages for 24 hours.  ACTIVITY:  You should plan to take it easy for the rest of today and you should NOT DRIVE or use heavy machinery until tomorrow (because of the sedation medicines used during the test).    FOLLOW UP: Our staff will call the number listed on your records the next business day following your procedure to check on you and address any questions or concerns that you may have regarding the information given to you following your procedure. If we do not reach you, we will leave a message.  However, if you are feeling well and you are not experiencing any problems, there is no need to return our call.  We will assume that you have returned to your regular daily activities without incident.  If any biopsies were taken you will be contacted by phone or by letter within the next 1-3 weeks.  Please call us at [phone] if you have not heard about the biopsies in 3 weeks.    SIGNATURES/CONFIDENTIALITY: You and/or your care partner have signed paperwork which will be entered into your electronic medical record.  These signatures attest to the fact that that the information above on your After Visit Summary has been reviewed and is understood.  Full responsibility of the confidentiality of this discharge information lies with you and/or your care-partner.  Hiatal hernia information given.  Recall colonoscopy 3 years pending pathology  results.  Dr. Fuller Plan will advise in letter.

## 2016-01-14 NOTE — Op Note (Signed)
[facility] Patient Name: Phillip Perry Procedure Date: 01/14/2016 2:31 PM MRN: [id number] Endoscopist: Ladene Artist , MD Age: 60 Referring MD:  Date of Birth: 1955/08/24 Gender: Male Account #: [id number] Procedure:                Upper GI endoscopy Indications:              Screening for Barrett's esophagus Medicines:                Monitored Anesthesia Care Procedure:                Pre-Anesthesia Assessment:                           - Prior to the procedure, a History and Physical                            was performed, and patient medications and                            allergies were reviewed. The patient's tolerance of                            previous anesthesia was also reviewed. The risks                            and benefits of the procedure and the sedation                            options and risks were discussed with the patient.                            All questions were answered, and informed consent                            was obtained. Prior Anticoagulants: The patient has                            taken no previous anticoagulant or antiplatelet                            agents. ASA Grade Assessment: II - A patient with                            mild systemic disease. After reviewing the risks                            and benefits, the patient was deemed in                            satisfactory condition to undergo the procedure.                           After obtaining informed consent, the endoscope was  passed under direct vision. Throughout the                            procedure, the patient's blood pressure, pulse, and                            oxygen saturations were monitored continuously. The                            Model GIF-[id number]) scope was introduced                            through the mouth, and advanced to the second part                            of duodenum. The upper GI  endoscopy was                            accomplished without difficulty. The patient                            tolerated the procedure well. Scope In: Scope Out: Findings:                 The esophagus and gastroesophageal junction were                            examined with white light and narrow band imaging                            (NBI). There were esophageal mucosal changes                            secondary to established short-segment Barrett's                            disease. These changes involved the mucosa at the                            upper extent of the gastric folds (38 cm from the                            incisors) extending to the Z-line (36 cm from the                            incisors). The maximum longitudinal extent of these                            esophageal mucosal changes was 2 cm in length.                            Mucosa was biopsied with a cold forceps for  histology randomly at intervals of 1 cm in the                            lower third of the esophagus. One specimen bottle                            was sent to pathology.                           The exam of the esophagus was otherwise normal.                           Multiple 3 to 5 mm sessile polyps with no bleeding                            and no stigmata of recent bleeding were found in                            the gastric fundus and in the gastric body.                            Previous biopsies showed benign fundic gland polyps.                           A small hiatal hernia was present.                           The exam of the stomach was otherwise normal.                           The duodenal bulb and second portion of the                            duodenum were normal. Complications:            No immediate complications. Estimated blood loss:                            None. Estimated Blood Loss:     Estimated blood loss:  none. Impression:               - Esophageal mucosal changes secondary to                            established short-segment Barrett's disease.                            Biopsied.                           - Multiple gastric polyps.                           - Small hiatal hernia.                           -  Normal duodenal bulb and second portion of the                            duodenum. Recommendation:           - Patient has a contact number available for                            emergencies. The signs and symptoms of potential                            delayed complications were discussed with the                            patient. Return to normal activities tomorrow.                            Written discharge instructions were provided to the                            patient.                           - Resume previous diet.                           - Continue present medications.                           - Await pathology results.                           - Repeat upper endoscopy in 3 years for                            surveillance pending pathology review. Ladene Artist, MD 01/14/2016 2:52:10 PM This report has been signed electronically.

## 2016-01-17 ENCOUNTER — Telehealth: Payer: Self-pay

## 2016-01-17 NOTE — Telephone Encounter (Signed)
  Follow up Call-  Call back number 01/14/2016  Post procedure Call Back phone  # [phone] hm  Permission to leave phone message Yes  Some recent data might be hidden     Patient questions:  Do you have a fever, pain , or abdominal swelling? No. Pain Score  0 *  Have you tolerated food without any problems? Yes.    Have you been able to return to your normal activities? Yes.    Do you have any questions about your discharge instructions: Diet   No. Medications  No. Follow up visit  No.  Do you have questions or concerns about your Care? No.  Actions: * If pain score is 4 or above: No action needed, pain <4.

## 2016-01-19 DIAGNOSIS — R531 Weakness: Secondary | ICD-10-CM | POA: Diagnosis not present

## 2016-01-26 DIAGNOSIS — R531 Weakness: Secondary | ICD-10-CM | POA: Diagnosis not present

## 2016-02-01 ENCOUNTER — Encounter: Payer: Self-pay | Admitting: Gastroenterology

## 2016-02-02 DIAGNOSIS — R531 Weakness: Secondary | ICD-10-CM | POA: Diagnosis not present

## 2016-02-08 ENCOUNTER — Ambulatory Visit (INDEPENDENT_AMBULATORY_CARE_PROVIDER_SITE_OTHER): Payer: BLUE CROSS/BLUE SHIELD | Admitting: Internal Medicine

## 2016-02-08 ENCOUNTER — Encounter: Payer: Self-pay | Admitting: Internal Medicine

## 2016-02-08 ENCOUNTER — Other Ambulatory Visit (INDEPENDENT_AMBULATORY_CARE_PROVIDER_SITE_OTHER): Payer: BLUE CROSS/BLUE SHIELD

## 2016-02-08 VITALS — BP 118/70 | HR 89 | Temp 98.4°F | Resp 20 | Ht 74.75 in | Wt 272.2 lb

## 2016-02-08 DIAGNOSIS — Z0000 Encounter for general adult medical examination without abnormal findings: Secondary | ICD-10-CM

## 2016-02-08 DIAGNOSIS — R7989 Other specified abnormal findings of blood chemistry: Secondary | ICD-10-CM | POA: Diagnosis not present

## 2016-02-08 LAB — CBC WITH DIFFERENTIAL/PLATELET
Basophils Absolute: 0.1 10*3/uL (ref 0.0–0.1)
Basophils Relative: 0.8 % (ref 0.0–3.0)
Eosinophils Absolute: 0.2 10*3/uL (ref 0.0–0.7)
Eosinophils Relative: 2.6 % (ref 0.0–5.0)
HCT: 46.7 % (ref 39.0–52.0)
Hemoglobin: 16.4 g/dL (ref 13.0–17.0)
Lymphocytes Relative: 36.2 % (ref 12.0–46.0)
Lymphs Abs: 2.6 10*3/uL (ref 0.7–4.0)
MCHC: 35.2 g/dL (ref 30.0–36.0)
MCV: 89.7 fl (ref 78.0–100.0)
Monocytes Absolute: 0.8 10*3/uL (ref 0.1–1.0)
Monocytes Relative: 11 % (ref 3.0–12.0)
Neutro Abs: 3.6 10*3/uL (ref 1.4–7.7)
Neutrophils Relative %: 49.4 % (ref 43.0–77.0)
Platelets: 213 10*3/uL (ref 150.0–400.0)
RBC: 5.21 Mil/uL (ref 4.22–5.81)
RDW: 13.9 % (ref 11.5–15.5)
WBC: 7.3 10*3/uL (ref 4.0–10.5)

## 2016-02-08 LAB — HEPATIC FUNCTION PANEL
ALT: 41 U/L (ref 0–53)
AST: 38 U/L — ABNORMAL HIGH (ref 0–37)
Albumin: 4.5 g/dL (ref 3.5–5.2)
Alkaline Phosphatase: 60 U/L (ref 39–117)
Bilirubin, Direct: 0.1 mg/dL (ref 0.0–0.3)
Total Bilirubin: 0.6 mg/dL (ref 0.2–1.2)
Total Protein: 6.9 g/dL (ref 6.0–8.3)

## 2016-02-08 LAB — POC URINALSYSI DIPSTICK (AUTOMATED)
Bilirubin, UA: NEGATIVE
Blood, UA: NEGATIVE
Glucose, UA: NEGATIVE
Ketones, UA: NEGATIVE
Leukocytes, UA: NEGATIVE
Nitrite, UA: NEGATIVE
Protein, UA: NEGATIVE
Spec Grav, UA: 1.02
Urobilinogen, UA: 0.2
pH, UA: 6.5

## 2016-02-08 LAB — LIPID PANEL
Cholesterol: 184 mg/dL (ref 0–200)
HDL: 28 mg/dL — ABNORMAL LOW (ref >39.00)
NonHDL: 156.38
Total CHOL/HDL Ratio: 7
Triglycerides: 259 mg/dL — ABNORMAL HIGH (ref 0.0–149.0)
VLDL: 51.8 mg/dL — ABNORMAL HIGH (ref 0.0–40.0)

## 2016-02-08 LAB — BASIC METABOLIC PANEL
BUN: 16 mg/dL (ref 6–23)
CO2: 31 mEq/L (ref 19–32)
Calcium: 9.2 mg/dL (ref 8.4–10.5)
Chloride: 104 mEq/L (ref 96–112)
Creatinine, Ser: 1.11 mg/dL (ref 0.40–1.50)
GFR: 72.24 mL/min (ref >60.00)
Glucose, Bld: 120 mg/dL — ABNORMAL HIGH (ref 70–99)
Potassium: 4.9 mEq/L (ref 3.5–5.1)
Sodium: 140 mEq/L (ref 135–145)

## 2016-02-08 LAB — LDL CHOLESTEROL, DIRECT: Direct LDL: 99 mg/dL

## 2016-02-08 LAB — PSA: PSA: 0.97 ng/mL (ref 0.10–4.00)

## 2016-02-08 LAB — TSH: TSH: 2.12 u[IU]/mL (ref 0.35–4.50)

## 2016-02-08 MED ORDER — FLUTICASONE PROPIONATE 50 MCG/ACT NA SUSP: 2.0000 | Freq: Every day | NASAL | 11 refills | Status: DC

## 2016-02-08 MED ORDER — VITAMIN B-12 1000 MCG PO TABS: 1000.0000 ug | ORAL_TABLET | Freq: Every day | ORAL | Status: DC

## 2016-02-08 MED ORDER — OMEPRAZOLE 20 MG PO CPDR: 20.0000 mg | DELAYED_RELEASE_CAPSULE | Freq: Every day | ORAL | 3 refills | Status: DC

## 2016-02-08 MED ORDER — BENAZEPRIL HCL 20 MG PO TABS: 20.0000 mg | ORAL_TABLET | Freq: Every day | ORAL | 3 refills | Status: DC
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
## 2016-02-08 NOTE — Progress Notes (Signed)
Pre visit review using our clinic review tool, if applicable. No additional management support is needed unless otherwise documented below in the visit note. 

## 2016-02-08 NOTE — Patient Instructions (Signed)
Limit your sodium (Salt) intake  Please check your blood pressure on a regular basis.  If it is consistently greater than 150/90, please make an office appointment.    It is important that you exercise regularly, at least 20 minutes 3 to 4 times per week.  If you develop chest pain or shortness of breath seek  medical attention.  You need to lose weight.  Consider a lower calorie diet and regular exercise.  Neurology follow-up as scheduled  Vitamin B12 supplements 1 mg daily

## 2016-05-03 ENCOUNTER — Ambulatory Visit (INDEPENDENT_AMBULATORY_CARE_PROVIDER_SITE_OTHER): Payer: BLUE CROSS/BLUE SHIELD | Admitting: Neurology

## 2016-05-03 ENCOUNTER — Encounter: Payer: Self-pay | Admitting: Neurology

## 2016-05-03 VITALS — BP 110/80 | HR 80 | Ht 74.75 in | Wt 268.1 lb

## 2016-05-03 DIAGNOSIS — H532 Diplopia: Secondary | ICD-10-CM | POA: Diagnosis not present

## 2016-05-03 DIAGNOSIS — R270 Ataxia, unspecified: Secondary | ICD-10-CM | POA: Diagnosis not present

## 2016-05-03 DIAGNOSIS — H02401 Unspecified ptosis of right eyelid: Secondary | ICD-10-CM | POA: Diagnosis not present
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
## 2016-05-03 NOTE — Patient Instructions (Signed)
We will call you with more information after I have discussed your care with Dr. Carles Collet

## 2016-05-04 ENCOUNTER — Telehealth: Payer: Self-pay | Admitting: Neurology

## 2016-05-04 NOTE — Telephone Encounter (Signed)
Return call to patient. Patient was informed that after discussion with Dr. Carles Collet regarding his symptoms and care, genetic testing for spinocerebellar ataxia would be the most appropriate next step.  I offered to verify from a insurance coverage for genetic testing for SCA (dominant) panel, however patient does not wish to proceed with genetic testing at this time. Instead, he will monitor his symptoms clinically and if there is any worsening return to our clinic. Recommend future follow-up with Dr. Carles Collet for ataxia.  Donika K. Posey Pronto, DO

## 2016-05-04 NOTE — Telephone Encounter (Signed)
Patient returned Dr Posey Pronto call. Per patient please call him back on [phone]

## 2016-05-16 ENCOUNTER — Encounter: Payer: Self-pay | Admitting: Family Medicine

## 2016-05-16 ENCOUNTER — Ambulatory Visit (INDEPENDENT_AMBULATORY_CARE_PROVIDER_SITE_OTHER): Payer: BLUE CROSS/BLUE SHIELD | Admitting: Family Medicine

## 2016-05-16 VITALS — BP 140/98 | HR 100 | Temp 98.3°F | Resp 16 | Ht 74.75 in | Wt 265.4 lb

## 2016-05-16 DIAGNOSIS — J069 Acute upper respiratory infection, unspecified: Secondary | ICD-10-CM | POA: Diagnosis not present

## 2016-05-16 DIAGNOSIS — J4520 Mild intermittent asthma, uncomplicated: Secondary | ICD-10-CM | POA: Diagnosis not present

## 2016-05-16 DIAGNOSIS — I10 Essential (primary) hypertension: Secondary | ICD-10-CM | POA: Diagnosis not present

## 2016-05-16 MED ORDER — IPRATROPIUM-ALBUTEROL 0.5-2.5 (3) MG/3ML IN SOLN
3.0000 mL | Freq: Once | RESPIRATORY_TRACT | Status: AC
  Administered 2016-05-16: 3 mL via RESPIRATORY_TRACT

## 2016-05-16 MED ORDER — ALBUTEROL SULFATE HFA 108 (90 BASE) MCG/ACT IN AERS: INHALATION_SPRAY | RESPIRATORY_TRACT | 0 refills | Status: DC

## 2016-05-16 MED ORDER — BENZONATATE 100 MG PO CAPS: 200.0000 mg | ORAL_CAPSULE | Freq: Two times a day (BID) | ORAL | 0 refills | Status: AC | PRN

## 2016-05-16 MED ORDER — PREDNISONE 20 MG PO TABS: 40.0000 mg | ORAL_TABLET | Freq: Every day | ORAL | 0 refills | Status: AC

## 2016-05-16 NOTE — Patient Instructions (Signed)
  [name] I have seen you today for an acute visit.  1. Reactive airway disease, mild intermittent, uncomplicated  - ipratropium-albuterol (DUONEB) 0.5-2.5 (3) MG/3ML nebulizer solution 3 mL; Take 3 mLs by nebulization once. - predniSONE (DELTASONE) 20 MG tablet; Take 2 tablets (40 mg total) by mouth daily with breakfast.  Dispense: 6 tablet; Refill: 0 - albuterol (PROVENTIL HFA;VENTOLIN HFA) 108 (90 Base) MCG/ACT inhaler; Every 6 hours x 7 days then PRN  Dispense: 1 Inhaler; Refill: 0 - DG Chest 2 View; Future  2. URI, acute  - ipratropium-albuterol (DUONEB) 0.5-2.5 (3) MG/3ML nebulizer solution 3 mL; Take 3 mLs by nebulization once. - benzonatate (TESSALON) 100 MG capsule; Take 2 capsules (200 mg total) by mouth 2 (two) times daily as needed.  Dispense: 45 capsule; Refill: 0  3. Essential hypertension  Monitor blood pressure, avoid cold meds.   viral infections are self-limited and we treat each symptom depending of severity.  Over the counter medications as decongestants and cold medications usually help, they need to be taken with caution if there is a history of high blood pressure or palpitations , SO NOT RECOMMENDED>  Tylenol and/or Ibuprofen also helps with most symptoms (headache, muscle aching, fever,etc) Plenty of fluids. Honey helps with cough. Steam inhalations helps with runny nose, nasal congestion, and may prevent sinus infections. Cough and nasal congestion could last a few days and sometimes weeks.    In general please monitor for signs of worsening symptoms and seek immediate medical attention if any concerning/warning symptom.     Please be sure you have an appointment already scheduled with your PCP before you leave today.
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
## 2016-05-16 NOTE — Progress Notes (Signed)
Pre visit review using our clinic review tool, if applicable. No additional management support is needed unless otherwise documented below in the visit note. 

## 2016-05-17 ENCOUNTER — Encounter: Payer: Self-pay | Admitting: Family Medicine

## 2016-05-17 ENCOUNTER — Ambulatory Visit (INDEPENDENT_AMBULATORY_CARE_PROVIDER_SITE_OTHER)
Admission: RE | Admit: 2016-05-17 | Discharge: 2016-05-17 | Disposition: A | Discharge status: Home / Self Care | Payer: BLUE CROSS/BLUE SHIELD | Source: Ambulatory Visit | Attending: Family Medicine | Admitting: Family Medicine

## 2016-05-17 DIAGNOSIS — J4520 Mild intermittent asthma, uncomplicated: Secondary | ICD-10-CM

## 2016-05-17 DIAGNOSIS — R05 Cough: Secondary | ICD-10-CM | POA: Diagnosis not present
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
## 2016-12-28 ENCOUNTER — Other Ambulatory Visit: Payer: Self-pay | Admitting: Internal Medicine

## 2017-01-03 ENCOUNTER — Other Ambulatory Visit: Payer: Self-pay | Admitting: Internal Medicine

## 2017-01-03 MED ORDER — BENAZEPRIL HCL 20 MG PO TABS: 20.0000 mg | ORAL_TABLET | Freq: Every day | ORAL | 0 refills | Status: DC

## 2017-01-16 ENCOUNTER — Telehealth: Payer: Self-pay | Admitting: Internal Medicine

## 2017-01-16 MED ORDER — OMEPRAZOLE 20 MG PO CPDR: 20.0000 mg | DELAYED_RELEASE_CAPSULE | Freq: Every day | ORAL | 3 refills | Status: DC

## 2017-01-16 NOTE — Telephone Encounter (Signed)
° ° °  Pt request refill of the following:   omeprazole (PRILOSEC) 20 MG capsule  Phamacy:  [facility]

## 2017-01-16 NOTE — Telephone Encounter (Signed)
Medication refilled

## 2017-02-08 ENCOUNTER — Other Ambulatory Visit: Payer: BLUE CROSS/BLUE SHIELD

## 2017-02-10 ENCOUNTER — Other Ambulatory Visit: Payer: Self-pay | Admitting: Internal Medicine

## 2017-02-12 ENCOUNTER — Ambulatory Visit (INDEPENDENT_AMBULATORY_CARE_PROVIDER_SITE_OTHER): Payer: BLUE CROSS/BLUE SHIELD | Admitting: Internal Medicine

## 2017-02-12 ENCOUNTER — Encounter: Payer: Self-pay | Admitting: Internal Medicine

## 2017-02-12 VITALS — BP 112/64 | HR 99 | Temp 98.4°F | Ht 74.0 in | Wt 274.6 lb

## 2017-02-12 DIAGNOSIS — K2270 Barrett's esophagus without dysplasia: Secondary | ICD-10-CM | POA: Diagnosis not present

## 2017-02-12 DIAGNOSIS — Z0000 Encounter for general adult medical examination without abnormal findings: Secondary | ICD-10-CM

## 2017-02-12 DIAGNOSIS — I10 Essential (primary) hypertension: Secondary | ICD-10-CM | POA: Diagnosis not present

## 2017-02-12 DIAGNOSIS — J4520 Mild intermittent asthma, uncomplicated: Secondary | ICD-10-CM | POA: Diagnosis not present

## 2017-02-12 DIAGNOSIS — R270 Ataxia, unspecified: Secondary | ICD-10-CM

## 2017-02-12 DIAGNOSIS — J301 Allergic rhinitis due to pollen: Secondary | ICD-10-CM

## 2017-02-12 LAB — COMPREHENSIVE METABOLIC PANEL
ALT: 37 U/L (ref 0–53)
AST: 31 U/L (ref 0–37)
Albumin: 4.5 g/dL (ref 3.5–5.2)
Alkaline Phosphatase: 67 U/L (ref 39–117)
BUN: 15 mg/dL (ref 6–23)
CO2: 27 mEq/L (ref 19–32)
Calcium: 9.6 mg/dL (ref 8.4–10.5)
Chloride: 104 mEq/L (ref 96–112)
Creatinine, Ser: 1.18 mg/dL (ref 0.40–1.50)
GFR: 67.09 mL/min (ref >60.00)
Glucose, Bld: 111 mg/dL — ABNORMAL HIGH (ref 70–99)
Potassium: 4.3 mEq/L (ref 3.5–5.1)
Sodium: 139 mEq/L (ref 135–145)
Total Bilirubin: 0.6 mg/dL (ref 0.2–1.2)
Total Protein: 7.2 g/dL (ref 6.0–8.3)

## 2017-02-12 LAB — LIPID PANEL
Cholesterol: 197 mg/dL (ref 0–200)
HDL: 37.6 mg/dL — ABNORMAL LOW (ref >39.00)
NonHDL: 159.85
Total CHOL/HDL Ratio: 5
Triglycerides: 246 mg/dL — ABNORMAL HIGH (ref 0.0–149.0)
VLDL: 49.2 mg/dL — ABNORMAL HIGH (ref 0.0–40.0)

## 2017-02-12 LAB — CBC WITH DIFFERENTIAL/PLATELET
Basophils Absolute: 0.1 10*3/uL (ref 0.0–0.1)
Basophils Relative: 1.2 % (ref 0.0–3.0)
Eosinophils Absolute: 0.1 10*3/uL (ref 0.0–0.7)
Eosinophils Relative: 1.7 % (ref 0.0–5.0)
HCT: 49.5 % (ref 39.0–52.0)
Hemoglobin: 16.7 g/dL (ref 13.0–17.0)
Lymphocytes Relative: 30 % (ref 12.0–46.0)
Lymphs Abs: 2.4 10*3/uL (ref 0.7–4.0)
MCHC: 33.7 g/dL (ref 30.0–36.0)
MCV: 91.8 fl (ref 78.0–100.0)
Monocytes Absolute: 0.8 10*3/uL (ref 0.1–1.0)
Monocytes Relative: 9.5 % (ref 3.0–12.0)
Neutro Abs: 4.7 10*3/uL (ref 1.4–7.7)
Neutrophils Relative %: 57.6 % (ref 43.0–77.0)
Platelets: 217 10*3/uL (ref 150.0–400.0)
RBC: 5.4 Mil/uL (ref 4.22–5.81)
RDW: 13.8 % (ref 11.5–15.5)
WBC: 8.1 10*3/uL (ref 4.0–10.5)

## 2017-02-12 LAB — TSH: TSH: 2.79 u[IU]/mL (ref 0.35–4.50)

## 2017-02-12 LAB — LDL CHOLESTEROL, DIRECT: Direct LDL: 113 mg/dL

## 2017-02-12 LAB — VITAMIN B12: Vitamin B-12: 791 pg/mL (ref 211–911)

## 2017-02-12 MED ORDER — FLUTICASONE PROPIONATE 50 MCG/ACT NA SUSP: 2.0000 | Freq: Every day | NASAL | 1 refills | Status: DC

## 2017-02-12 MED ORDER — LORATADINE 10 MG PO TABS: 10.0000 mg | ORAL_TABLET | Freq: Every day | ORAL | 1 refills | Status: DC | PRN

## 2017-02-12 MED ORDER — VITAMIN B-12 1000 MCG PO TABS: 1000.0000 ug | ORAL_TABLET | Freq: Every day | ORAL | 3 refills | Status: AC

## 2017-02-12 MED ORDER — BENAZEPRIL HCL 20 MG PO TABS: 20.0000 mg | ORAL_TABLET | Freq: Every day | ORAL | 1 refills | Status: DC

## 2017-02-12 MED ORDER — PYRIDOSTIGMINE BROMIDE 60 MG PO TABS: 60.0000 mg | ORAL_TABLET | Freq: Two times a day (BID) | ORAL | 0 refills | Status: DC | PRN

## 2017-02-12 MED ORDER — ALBUTEROL SULFATE HFA 108 (90 BASE) MCG/ACT IN AERS: INHALATION_SPRAY | RESPIRATORY_TRACT | 3 refills | Status: DC

## 2017-02-12 MED ORDER — OMEPRAZOLE 20 MG PO CPDR: 20.0000 mg | DELAYED_RELEASE_CAPSULE | Freq: Every day | ORAL | 3 refills | Status: DC

## 2017-02-12 NOTE — Patient Instructions (Signed)
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
## 2017-02-15 ENCOUNTER — Encounter: Payer: Self-pay | Admitting: Internal Medicine

## 2017-10-07 ENCOUNTER — Other Ambulatory Visit: Payer: Self-pay | Admitting: Internal Medicine

## 2017-11-05 ENCOUNTER — Other Ambulatory Visit: Payer: Self-pay | Admitting: Internal Medicine

## 2017-12-02 ENCOUNTER — Other Ambulatory Visit: Payer: Self-pay | Admitting: Internal Medicine

## 2017-12-21 ENCOUNTER — Encounter: Payer: Self-pay | Admitting: Family Medicine

## 2017-12-21 ENCOUNTER — Ambulatory Visit: Payer: BLUE CROSS/BLUE SHIELD | Admitting: Family Medicine

## 2017-12-21 VITALS — BP 110/78 | HR 84 | Temp 97.9°F | Ht 74.0 in | Wt 271.0 lb

## 2017-12-21 DIAGNOSIS — I10 Essential (primary) hypertension: Secondary | ICD-10-CM

## 2017-12-21 DIAGNOSIS — G629 Polyneuropathy, unspecified: Secondary | ICD-10-CM | POA: Diagnosis not present

## 2017-12-21 DIAGNOSIS — I8393 Asymptomatic varicose veins of bilateral lower extremities: Secondary | ICD-10-CM

## 2017-12-21 DIAGNOSIS — K219 Gastro-esophageal reflux disease without esophagitis: Secondary | ICD-10-CM

## 2017-12-21 DIAGNOSIS — H532 Diplopia: Secondary | ICD-10-CM

## 2017-12-21 NOTE — Patient Instructions (Addendum)
Food Choices for Gastroesophageal Reflux Disease, Adult When you have gastroesophageal reflux disease (GERD), the foods you eat and your eating habits are very important. Choosing the right foods can help ease the discomfort of GERD. Consider working with a diet and nutrition specialist (dietitian) to help you make healthy food choices. What general guidelines should I follow? Eating plan  Choose healthy foods low in fat, such as fruits, vegetables, whole grains, low-fat dairy products, and lean meat, fish, and poultry.  Eat frequent, small meals instead of three large meals each day. Eat your meals slowly, in a relaxed setting. Avoid bending over or lying down until 2-3 hours after eating.  Limit high-fat foods such as fatty meats or fried foods.  Limit your intake of oils, butter, and shortening to less than 8 teaspoons each day.  Avoid the following: ? Foods that cause symptoms. These may be different for different people. Keep a food diary to keep track of foods that cause symptoms. ? Alcohol. ? Drinking large amounts of liquid with meals. ? Eating meals during the 2-3 hours before bed.  Cook foods using methods other than frying. This may include baking, grilling, or broiling. Lifestyle   Maintain a healthy weight. Ask your health care provider what weight is healthy for you. If you need to lose weight, work with your health care provider to do so safely.  Exercise for at least 30 minutes on 5 or more days each week, or as told by your health care provider.  Avoid wearing clothes that fit tightly around your waist and chest.  Do not use any products that contain nicotine or tobacco, such as cigarettes and e-cigarettes. If you need help quitting, ask your health care provider.  Sleep with the head of your bed raised. Use a wedge under the mattress or blocks under the bed frame to raise the head of the bed. What foods are not recommended? The items listed may not be a complete  list. Talk with your dietitian about what dietary choices are best for you. Grains Pastries or quick breads with added fat. French toast. Vegetables Deep fried vegetables. French fries. Any vegetables prepared with added fat. Any vegetables that cause symptoms. For some people this may include tomatoes and tomato products, chili peppers, onions and garlic, and horseradish. Fruits Any fruits prepared with added fat. Any fruits that cause symptoms. For some people this may include citrus fruits, such as oranges, grapefruit, pineapple, and lemons. Meats and other protein foods High-fat meats, such as fatty beef or pork, hot dogs, ribs, ham, sausage, salami and bacon. Fried meat or protein, including fried fish and fried chicken. Nuts and nut butters. Dairy Whole milk and chocolate milk. Sour cream. Cream. Ice cream. Cream cheese. Milk shakes. Beverages Coffee and tea, with or without caffeine. Carbonated beverages. Sodas. Energy drinks. Fruit juice made with acidic fruits (such as orange or grapefruit). Tomato juice. Alcoholic drinks. Fats and oils Butter. Margarine. Shortening. Ghee. Sweets and desserts Chocolate and cocoa. Donuts. Seasoning and other foods Pepper. Peppermint and spearmint. Any condiments, herbs, or seasonings that cause symptoms. For some people, this may include curry, hot sauce, or vinegar-based salad dressings. Summary  When you have gastroesophageal reflux disease (GERD), food and lifestyle choices are very important to help ease the discomfort of GERD.  Eat frequent, small meals instead of three large meals each day. Eat your meals slowly, in a relaxed setting. Avoid bending over or lying down until 2-3 hours after eating.  Limit high-fat   foods such as fatty meat or fried foods. This information is not intended to replace advice given to you by your health care provider. Make sure you discuss any questions you have with your health care provider. Document Released:  05/15/2005 Document Revised: 05/16/2016 Document Reviewed: 05/16/2016 Elsevier Interactive Patient Education  2018 [employer].  Peripheral Neuropathy Peripheral neuropathy is a type of nerve damage. It affects nerves that carry signals between the spinal cord and other parts of the body. These are called peripheral nerves. With peripheral neuropathy, one nerve or a group of nerves may be damaged. What are the causes? Many things can damage peripheral nerves. For some people with peripheral neuropathy, the cause is unknown. Some causes include:  Diabetes. This is the most common cause of peripheral neuropathy.  Injury to a nerve.  Pressure or stress on a nerve that lasts a long time.  Too little vitamin B. Alcoholism can lead to this.  Infections.  Autoimmune diseases, such as multiple sclerosis and systemic lupus erythematosus.  Inherited nerve diseases.  Some medicines, such as cancer drugs.  Toxic substances, such as lead and mercury.  Too little blood flowing to the legs.  Kidney disease.  Thyroid disease.  What are the signs or symptoms? Different people have different symptoms. The symptoms you have will depend on which of your nerves is damaged. Common symptoms include:  Loss of feeling (numbness) in the feet and hands.  Tingling in the feet and hands.  Pain that burns.  Very sensitive skin.  Weakness.  Not being able to move a part of the body (paralysis).  Muscle twitching.  Clumsiness or poor coordination.  Loss of balance.  Not being able to control your bladder.  Feeling dizzy.  Sexual problems.  How is this diagnosed? Peripheral neuropathy is a symptom, not a disease. Finding the cause of peripheral neuropathy can be hard. To figure that out, your health care provider will take a medical history and do a physical exam. A neurological exam will also be done. This involves checking things affected by your brain, spinal cord, and nerves (nervous  system). For example, your health care provider will check your reflexes, how you move, and what you can feel. Other types of tests may also be ordered, such as:  Blood tests.  A test of the fluid in your spinal cord.  Imaging tests, such as CT scans or an MRI.  Electromyography (EMG). This test checks the nerves that control muscles.  Nerve conduction velocity tests. These tests check how fast messages pass through your nerves.  Nerve biopsy. A small piece of nerve is removed. It is then checked under a microscope.  How is this treated?  Medicine is often used to treat peripheral neuropathy. Medicines may include: ? Pain-relieving medicines. Prescription or over-the-counter medicine may be suggested. ? Antiseizure medicine. This may be used for pain. ? Antidepressants. These also may help ease pain from neuropathy. ? Lidocaine. This is a numbing medicine. You might wear a patch or be given a shot. ? Mexiletine. This medicine is typically used to help control irregular heart rhythms.  Surgery. Surgery may be needed to relieve pressure on a nerve or to destroy a nerve that is causing pain.  Physical therapy to help movement.  Assistive devices to help movement. Follow these instructions at home:  Only take over-the-counter or prescription medicines as directed by your health care provider. Follow the instructions carefully for any given medicines. Do not take any other medicines without first  getting approval from your health care provider.  If you have diabetes, work closely with your health care provider to keep your blood sugar under control.  If you have numbness in your feet: ? Check every day for signs of injury or infection. Watch for redness, warmth, and swelling. ? Wear padded socks and comfortable shoes. These help protect your feet.  Do not do things that put pressure on your damaged nerve.  Do not smoke. Smoking keeps blood from getting to damaged nerves.  Avoid  or limit alcohol. Too much alcohol can cause a lack of B vitamins. These vitamins are needed for healthy nerves.  Develop a good support system. Coping with peripheral neuropathy can be stressful. Talk to a mental health specialist or join a support group if you are struggling.  Follow up with your health care provider as directed. Contact a health care provider if:  You have new signs or symptoms of peripheral neuropathy.  You are struggling emotionally from dealing with peripheral neuropathy.  You have a fever. Get help right away if:  You have an injury or infection that is not healing.  You feel very dizzy or begin vomiting.  You have chest pain.  You have trouble breathing. This information is not intended to replace advice given to you by your health care provider. Make sure you discuss any questions you have with your health care provider. Document Released: 05/05/2002 Document Revised: 10/21/2015 Document Reviewed: 01/20/2013 Elsevier Interactive Patient Education  2017 [employer]. Hyperhidrosis It is normal to sweat when you are hot, being physically active, or feeling anxious. Sweating is a necessary function for your body. However, hyperhidrosis is when you sweat too much (excessively). Although hyperhidrosis is not dangerous, it can make you feel embarrassed. There are two kinds of hyperhidrosis:  Primary hyperhidrosis. The sweating usually localizes in one part of your body, such as your underarms, or in a few areas, such as your feet, face, armpits, and hands. This is the more common kind of hyperhidrosis.  Secondary hyperhidrosis. This type more likely affects your entire body.  What are the causes? The cause of your hyperhidrosis depends on the kind you have.  Primary hyperhidrosis may be caused by having sweat glands that are more active than normal.  Secondary hyperhidrosis is caused by an underlying condition. Possible conditions  include: ? Diabetes. ? Gout. ? Certain medicines. ? Anxiety. ? Stroke. ? Obesity. ? Menopause. ? Overactive thyroid (hyperthyroidism). ? Tumors. ? Frostbite. ? Certain types of cancers. ? Alcoholism. ? Injury to your nervous system. ? Stroke. ? Parkinson disease.  What increases the risk? You may be at an increased risk for primary hyperhidrosis if you have a family history of it. What are the signs or symptoms? General symptoms of hyperhidrosis may include:  Feeling like you are sweating constantly, even while you are resting.  Having skin that peels or gets paler or softer in the areas where you sweat the most.  Being able to see sweat on your skin.  Symptoms of primary hyperhidrosis may include:  Sweating in specific areas, such as your armpits, palms, feet, and face.  Sweating in the same location on both sides of your body.  Sweating only during the day.  Symptoms of secondary hyperhidrosis may include:  Sweating all over your body.  Sweating even while you sleep.  How is this diagnosed? Hyperhidrosis may be diagnosed by:  Medical history and physical exam.  Testing, such as: ? Sweat test. ? Paper test.  How is this treated? Your treatment will depend on the kind of hyperhidrosis you have and the parts of your body that are affected. If your hyperhidrosis is caused by an underlying condition, your treatment will address the cause. Treatment may include:  Strong antiperspirants. Your health care provider may give you a prescription.  Medicines taken by mouth.  Medicines injected by your health care provider. These may include small amounts of botulinum toxin.  Iontophoresis. This is a procedure that temporarily turns off the sweat glands in your hands and feet.  Surgery to remove your sweat glands.  Sympathectomy. This is a procedure that cuts or destroys your nerves so that they do not send a signal to sweat.  Follow these instructions at  home:  Take medicines only as directed by your health care provider.  Use antiperspirants as directed by your health care provider.  Limit or avoid foods or beverages that seem to increase your chances of sweating, such as: ? Spicy food. ? Caffeine. ? Alcohol. ? Foods that contain MSG.  If your feet sweat: ? Wear sandals, when possible. ? Do not wear cotton socks. Wear socks that remove or wick moisture from your feet. ? Wear leather shoes. ? Avoid wearing the same pair of shoes two days in a row.  Consider joining a hyperhidrosis support group. Contact a health care provider if:  You have new symptoms.  Your symptoms get worse. This information is not intended to replace advice given to you by your health care provider. Make sure you discuss any questions you have with your health care provider. Document Released: 07/14/2005 Document Revised: 10/21/2015 Document Reviewed: 12/23/2013 Elsevier Interactive Patient Education  2018 [employer]. Diplopia Diplopia is the condition of having double vision or seeing two of a single object. There are many causes of diplopia. Some are not dangerous and can be easily corrected. Diplopia may also be a symptom of a serious medical problem. There are two types of diplopia.  Monocular diplopia. This is double vision that affects only one eye. Monocular diplopia is often caused by a clouding of the lens in your eye (cataract) or by disruptions in the way that your eye focuses light.  Binocular diplopia. This is double vision that affects both eyes. However, when you shut one eye, the double vision will go away. Binocular diplopia may be more serious. It can be caused by: ? Problems with the nerves or muscles that are responsible for eye movement. ? Neurologic diseases. ? Thyroid problems. ? Tumors. ? An infection near your eyes. ? A stroke.  You may need to see a health care provider who specializes in eye conditions (ophthalmologist) or a  nerve specialist (neurologist) to find the cause. Follow these instructions at home:  Tell your health care provider about any changes in your vision.  Do not drive or operate heavy machinery if diplopia interferes with your vision.  Keep all follow-up visits as directed by your health care provider. This is important. Contact a health care provider if:  Your diplopia gets worse.  You develop any other symptoms along with your diplopia, such as: ? Weakness. ? Numbness. ? Headache. ? Eye pain. ? Clumsiness. ? Nausea. ? Drooping eyelids. ? Abnormal movement of one of your eyes. Get help right away if:  You have sudden vision loss.  You suddenly get a very bad headache.  You have sudden weakness or numbness.  You suddenly lose the ability to speak, understand speech, or both. This information  is not intended to replace advice given to you by your health care provider. Make sure you discuss any questions you have with your health care provider. Document Released: 03/16/2004 Document Revised: 10/21/2015 Document Reviewed: 04/08/2014 Elsevier Interactive Patient Education  2018 [employer].  Nonsurgical Procedures for Varicose Veins Various nonsurgical procedures can be used to treat varicose veins. Varicose veins are swollen, twisted veins that are visible under the skin. They occur most often in the legs. These veins may appear blue and bulging. Varicose veins are caused by damage to the valves in veins. All veins have a valve that makes blood flow in only one direction. If a valve gets weak or damaged, blood can pool and cause varicose veins. You may need a procedure to treat your varicose veins if they are causing symptoms or complications, or if lifestyle changes have not helped. These procedures can reduce pain, aching, and the risk of bleeding and blood clots. They can also improve the way the affected area looks (cosmetic appearance). The three common nonsurgical procedures  are:  Sclerotherapy. A chemical is injected to close off a vein.  Laser treatment. Light energy is applied to close off the vein.  Radiofrequency vein ablation. Electrical energy is used to produce heat that closes off the vein.  Your health care provider will discuss the method that is best for you based on your condition. Tell a health care provider about:  Any allergies you have.  All medicines you are taking, including vitamins, herbs, eye drops, creams, and over-the-counter medicines.  Any problems you or family members have had with anesthetic medicines.  Any blood disorders you have.  Any surgeries you have had.  Any medical conditions you have.  Whether you are pregnant or may be pregnant. What are the risks? Generally, this is a safe procedure. However, problems may occur, including:  Damage to nearby nerves, tissues, or veins.  Skin irritation, sores, or dark spots.  Numbness.  Clotting.  Infection.  Allergic reactions to medicines.  Scarring.  Leg swelling.  Need for additional treatments.  Bruising.  What happens before the procedure?  Ask your health care provider about: ? Changing or stopping your regular medicines. This is especially important if you are taking diabetes medicines or blood thinners. ? Taking over-the-counter medicines, vitamins, herbs, and supplements. ? Taking medicines such as aspirin and ibuprofen. These medicines can thin your blood. Do not take these medicines unless your health care provider tells you to take them.  You may have an exam or testing. This can include a tests to: ? Check for clots and check blood flow using sound waves (Doppler ultrasound). ? Observe how blood flows through your veins by injecting a dye that outlines your veins on X-rays (angiogram). This test is used in rare cases. What happens during the procedure? One of the following procedures will be performed: Sclerotherapy This procedure is often  used for small to medium veins.  A chemical (sclerosant) that irritates the lining of the vein will be injected into the vein. This will cause the varicose vein to be closed off. Sclerosants in different amounts and strengths can be used, depending on the size and location of the vein.  All of the varicose vein sites will be injected. You may need more than one treatment because new varicose veins may develop, or more than one injection may be needed for each varicose vein.  Laser treatment There are two ways that lasers are used to treat varicose veins:  Light energy from a laser may be directed onto the vein through the skin.  A needle may be used to pass a thin laser catheter into the vein to cause it to close.  You may need more than one treatment if the vein re-opens. In some cases, laser treatment may be combined with sclerotherapy. Radiofrequency vein ablation  You will be given a medicine that numbs the area (local anesthetic).  A small incision will be made near the varicose vein.  A thin tube (catheter) will be threaded into your vein.  The tip of the catheter will deploy electrodes.  The electrodes will deliver electrical energy to produce heat that closes off the vein. What happens after the procedure?  A bandage (dressing) may be used to cover the injection site or incisions.  You may have to wear compression stockings. These stockings help to prevent blood clots and reduce swelling in your legs.  Return to your normal activities as told by your health care provider. Summary  Varicose veins are swollen, twisted veins that are visible under the skin. They occur most often in the legs.  Various procedures can be used to treat varicose veins. You may need a procedure to treat your varicose veins if they are causing symptoms or complications, or if lifestyle changes have not helped.  Your health care provider will discuss the method that is best for you based on your  condition. This information is not intended to replace advice given to you by your health care provider. Make sure you discuss any questions you have with your health care provider. Document Released: 08/25/2016 Document Revised: 08/25/2016 Document Reviewed: 08/25/2016 Elsevier Interactive Patient Education  2018 [employer].
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
## 2018-01-01 ENCOUNTER — Other Ambulatory Visit: Payer: Self-pay | Admitting: Internal Medicine

## 2018-01-31 ENCOUNTER — Other Ambulatory Visit: Payer: Self-pay | Admitting: Internal Medicine

## 2018-02-01 ENCOUNTER — Encounter: Payer: BLUE CROSS/BLUE SHIELD | Admitting: Family Medicine

## 2018-02-04 ENCOUNTER — Other Ambulatory Visit: Payer: Self-pay | Admitting: Internal Medicine

## 2018-02-06 ENCOUNTER — Encounter: Payer: Self-pay | Admitting: Family Medicine

## 2018-02-06 ENCOUNTER — Other Ambulatory Visit: Payer: Self-pay

## 2018-02-06 ENCOUNTER — Ambulatory Visit (INDEPENDENT_AMBULATORY_CARE_PROVIDER_SITE_OTHER): Payer: BLUE CROSS/BLUE SHIELD | Admitting: Family Medicine

## 2018-02-06 VITALS — BP 128/88 | HR 78 | Temp 98.3°F | Ht 74.0 in | Wt 269.0 lb

## 2018-02-06 DIAGNOSIS — Z13220 Encounter for screening for lipoid disorders: Secondary | ICD-10-CM

## 2018-02-06 DIAGNOSIS — Z8639 Personal history of other endocrine, nutritional and metabolic disease: Secondary | ICD-10-CM | POA: Diagnosis not present

## 2018-02-06 DIAGNOSIS — Z1159 Encounter for screening for other viral diseases: Secondary | ICD-10-CM | POA: Diagnosis not present

## 2018-02-06 DIAGNOSIS — Z125 Encounter for screening for malignant neoplasm of prostate: Secondary | ICD-10-CM | POA: Diagnosis not present

## 2018-02-06 DIAGNOSIS — Z0000 Encounter for general adult medical examination without abnormal findings: Secondary | ICD-10-CM | POA: Diagnosis not present

## 2018-02-06 DIAGNOSIS — Z131 Encounter for screening for diabetes mellitus: Secondary | ICD-10-CM

## 2018-02-06 LAB — BASIC METABOLIC PANEL
BUN: 17 mg/dL (ref 6–23)
CO2: 30 mEq/L (ref 19–32)
Calcium: 9.7 mg/dL (ref 8.4–10.5)
Chloride: 101 mEq/L (ref 96–112)
Creatinine, Ser: 1.06 mg/dL (ref 0.40–1.50)
GFR: 75.67 mL/min (ref >60.00)
Glucose, Bld: 112 mg/dL — ABNORMAL HIGH (ref 70–99)
Potassium: 5 mEq/L (ref 3.5–5.1)
Sodium: 137 mEq/L (ref 135–145)

## 2018-02-06 LAB — LIPID PANEL
Cholesterol: 170 mg/dL (ref 0–200)
HDL: 33.6 mg/dL — ABNORMAL LOW (ref >39.00)
NonHDL: 135.97
Total CHOL/HDL Ratio: 5
Triglycerides: 244 mg/dL — ABNORMAL HIGH (ref 0.0–149.0)
VLDL: 48.8 mg/dL — ABNORMAL HIGH (ref 0.0–40.0)

## 2018-02-06 LAB — LDL CHOLESTEROL, DIRECT: Direct LDL: 101 mg/dL

## 2018-02-06 LAB — CBC WITH DIFFERENTIAL/PLATELET
Basophils Absolute: 0.1 10*3/uL (ref 0.0–0.1)
Basophils Relative: 1 % (ref 0.0–3.0)
Eosinophils Absolute: 0.1 10*3/uL (ref 0.0–0.7)
Eosinophils Relative: 1.8 % (ref 0.0–5.0)
HCT: 46.3 % (ref 39.0–52.0)
Hemoglobin: 16.1 g/dL (ref 13.0–17.0)
Lymphocytes Relative: 35.4 % (ref 12.0–46.0)
Lymphs Abs: 2.2 10*3/uL (ref 0.7–4.0)
MCHC: 34.7 g/dL (ref 30.0–36.0)
MCV: 89.5 fl (ref 78.0–100.0)
Monocytes Absolute: 0.6 10*3/uL (ref 0.1–1.0)
Monocytes Relative: 10.4 % (ref 3.0–12.0)
Neutro Abs: 3.2 10*3/uL (ref 1.4–7.7)
Neutrophils Relative %: 51.4 % (ref 43.0–77.0)
Platelets: 206 10*3/uL (ref 150.0–400.0)
RBC: 5.17 Mil/uL (ref 4.22–5.81)
RDW: 13.5 % (ref 11.5–15.5)
WBC: 6.1 10*3/uL (ref 4.0–10.5)

## 2018-02-06 LAB — PSA: PSA: 0.68 ng/mL (ref 0.10–4.00)

## 2018-02-06 LAB — VITAMIN B12: Vitamin B-12: 826 pg/mL (ref 211–911)

## 2018-02-06 LAB — HEMOGLOBIN A1C: Hgb A1c MFr Bld: 6.2 % (ref 4.6–6.5)

## 2018-02-06 MED ORDER — BENAZEPRIL HCL 20 MG PO TABS: 20.0000 mg | ORAL_TABLET | Freq: Every day | ORAL | 3 refills | Status: DC

## 2018-02-06 MED ORDER — FLUTICASONE PROPIONATE 50 MCG/ACT NA SUSP: 2.0000 | Freq: Every day | NASAL | 1 refills | Status: AC

## 2018-02-06 MED ORDER — LORATADINE 10 MG PO TABS: 10.0000 mg | ORAL_TABLET | Freq: Every day | ORAL | 0 refills | Status: AC | PRN

## 2018-02-06 MED ORDER — OMEPRAZOLE 20 MG PO CPDR: 20.0000 mg | DELAYED_RELEASE_CAPSULE | Freq: Every day | ORAL | 3 refills | Status: DC

## 2018-02-06 NOTE — Progress Notes (Signed)
Subjective:     Phillip Perry is a 62 y.o. male and is here for a comprehensive physical exam. The patient reports problems - continued balance issues.  Pt seen by neurologist, imaging of brain negative.  Pt uses extra caution. Now rides a tricycle instead of a bike.  Did PT in the past, but no improvement noted.  Pt does not feel dizzy or like the room is spinning.  Endorses double vision.  Pt with h/o low normal b12, taking po b12 1000 mcg daily.  HTN: taking beazepril 20 mg GERD: taking omeprazole. Last colonoscopy 2010, EGD 2017.  Social History   Socioeconomic History  . Marital status: Married    Spouse name: Not on file  . Number of children: Not on file  . Years of education: Not on file  . Highest education level: Not on file  Occupational History  . Occupation: Press photographer    Comment: [employer]  Social Needs  . Financial resource strain: Not on file  . Food insecurity:    Worry: Not on file    Inability: Not on file  . Transportation needs:    Medical: Not on file    Non-medical: Not on file  Tobacco Use  . Smoking status: Former Smoker    Last attempt to quit: 05/29/1988    Years since quitting: 29.7  . Smokeless tobacco: Never Used  Substance and Sexual Activity  . Alcohol use: Yes    Alcohol/week: 6.0 standard drinks    Types: 6 Cans of beer per week    Comment: 6 pack a week.  Drinks 2 beers several times per week.   . Drug use: No  . Sexual activity: Not on file  Lifestyle  . Physical activity:    Days per week: Not on file    Minutes per session: Not on file  . Stress: Not on file  Relationships  . Social connections:    Talks on phone: Not on file    Gets together: Not on file    Attends religious service: Not on file    Active member of club or organization: Not on file    Attends meetings of clubs or organizations: Not on file    Relationship status: Not on file  . Intimate partner violence:    Fear of current or ex partner: Not on file    Emotionally  abused: Not on file    Physically abused: Not on file    Forced sexual activity: Not on file  Other Topics Concern  . 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  Retired.    Health Maintenance  Topic Date Due  . Hepatitis C Screening  08-20-1955  . HIV Screening  10/25/1972  . INFLUENZA VACCINE  12/27/2017  . COLONOSCOPY  03/09/2019  . TETANUS/TDAP  04/28/2022    The following portions of the patient's history were reviewed and updated as appropriate: allergies, current medications, past family history, past medical history, past social history, past surgical history and problem list.  Review of Systems A comprehensive review of systems was negative.   +double vision, balance issues  Objective:    BP 128/88 (BP Location: Left Arm, Patient Position: Sitting, Cuff Size: Large)   Pulse 78   Temp 98.3 F (36.8 C) (Oral)   Ht 6\' 2"  (1.88 m)   Wt 269 lb (122 kg)   SpO2 95%  BMI 34.54 kg/m  General appearance: alert, cooperative and no distress Head: Normocephalic, without obvious abnormality, atraumatic Eyes: conjunctivae/corneas clear. PERRL, EOM's intact. Fundi benign. Ears: normal TM's and external ear canals both ears Nose: Nares normal. Septum midline. Mucosa normal. No drainage or sinus tenderness. Throat: lips, mucosa, and tongue normal; teeth and gums normal Neck: no adenopathy, no carotid bruit, no JVD, supple, symmetrical, trachea midline and thyroid not enlarged, symmetric, no tenderness/mass/nodules Lungs: clear to auscultation bilaterally Heart: regular rate and rhythm, S1, S2 normal, no murmur, click, rub or gallop Abdomen: soft, non-tender; bowel sounds normal; no masses,  no organomegaly Extremities: extremities normal, atraumatic, no cyanosis or edema Skin: Skin color, texture, turgor normal. No rashes or lesions Neurologic:A&Ox3, wide based gait, slowed finger to nose and rapid  alternating hand movements. Tongue with slight deviation to the left.  No nystagmus noted on eye exam--pt notes double vision      Assessment:    Healthy male exam.  Continued balance issues.      Plan:     Anticipatory guidance given including wearing seatbelts, smoke detectors in the home, increasing physical activity, increasing p.o. intake of water and vegetables. -obtain labs including PSA, lipids, Hgb A1C, CBC, BMP, Hep B, B12 -discussed shingles vaccine -colonoscopy/EGD due 2020 -continue meds for chronic conditions. See After Visit Summary for Counseling Recommendations   -next CPE in 1 yr.  F/u prn  Grier Mitts, MD

## 2018-02-06 NOTE — Patient Instructions (Signed)
Preventive Care 40-64 Years, Male Preventive care refers to lifestyle choices and visits with your health care provider that can promote health and wellness. What does preventive care include?  A yearly physical exam. This is also called an annual well check.  Dental exams once or twice a year.  Routine eye exams. Ask your health care provider how often you should have your eyes checked.  Personal lifestyle choices, including: ? Daily care of your teeth and gums. ? Regular physical activity. ? Eating a healthy diet. ? Avoiding tobacco and drug use. ? Limiting alcohol use. ? Practicing safe sex. ? Taking low-dose aspirin every day starting at age 39. What happens during an annual well check? The services and screenings done by your health care provider during your annual well check will depend on your age, overall health, lifestyle risk factors, and family history of disease. Counseling Your health care provider may ask you questions about your:  Alcohol use.  Tobacco use.  Drug use.  Emotional well-being.  Home and relationship well-being.  Sexual activity.  Eating habits.  Work and work Statistician.  Screening You may have the following tests or measurements:  Height, weight, and BMI.  Blood pressure.  Lipid and cholesterol levels. These may be checked every 5 years, or more frequently if you are over 76 years old.  Skin check.  Lung cancer screening. You may have this screening every year starting at age 19 if you have a 30-pack-year history of smoking and currently smoke or have quit within the past 15 years.  Fecal occult blood test (FOBT) of the stool. You may have this test every year starting at age 26.  Flexible sigmoidoscopy or colonoscopy. You may have a sigmoidoscopy every 5 years or a colonoscopy every 10 years starting at age 17.  Prostate cancer screening. Recommendations will vary depending on your family history and other risks.  Hepatitis C  blood test.  Hepatitis B blood test.  Sexually transmitted disease (STD) testing.  Diabetes screening. This is done by checking your blood sugar (glucose) after you have not eaten for a while (fasting). You may have this done every 1-3 years.  Discuss your test results, treatment options, and if necessary, the need for more tests with your health care provider. Vaccines Your health care provider may recommend certain vaccines, such as:  Influenza vaccine. This is recommended every year.  Tetanus, diphtheria, and acellular pertussis (Tdap, Td) vaccine. You may need a Td booster every 10 years.  Varicella vaccine. You may need this if you have not been vaccinated.  Zoster vaccine. You may need this after age 79.  Measles, mumps, and rubella (MMR) vaccine. You may need at least one dose of MMR if you were born in 1957 or later. You may also need a second dose.  Pneumococcal 13-valent conjugate (PCV13) vaccine. You may need this if you have certain conditions and have not been vaccinated.  Pneumococcal polysaccharide (PPSV23) vaccine. You may need one or two doses if you smoke cigarettes or if you have certain conditions.  Meningococcal vaccine. You may need this if you have certain conditions.  Hepatitis A vaccine. You may need this if you have certain conditions or if you travel or work in places where you may be exposed to hepatitis A.  Hepatitis B vaccine. You may need this if you have certain conditions or if you travel or work in places where you may be exposed to hepatitis B.  Haemophilus influenzae type b (Hib) vaccine.  You may need this if you have certain risk factors.  Talk to your health care provider about which screenings and vaccines you need and how often you need them. This information is not intended to replace advice given to you by your health care provider. Make sure you discuss any questions you have with your health care provider. Document Released: 06/11/2015  Document Revised: 02/02/2016 Document Reviewed: 03/16/2015 Elsevier Interactive Patient Education  2018 [place] Zoster (Shingles) Vaccine, RZV: What You Need to Know 1. Why get vaccinated? Shingles (also called herpes zoster, or just zoster) is a painful skin rash, often with blisters. Shingles is caused by the varicella zoster virus, the same virus that causes chickenpox. After you have chickenpox, the virus stays in your body and can cause shingles later in life. You can't catch shingles from another person. However, a person who has never had chickenpox (or chickenpox vaccine) could get chickenpox from someone with shingles. A shingles rash usually appears on one side of the face or body and heals within 2 to 4 weeks. Its main symptom is pain, which can be severe. Other symptoms can include fever, headache, chills and upset stomach. Very rarely, a shingles infection can lead to pneumonia, hearing problems, blindness, brain inflammation (encephalitis), or death. For about 1 person in 5, severe pain can continue even long after the rash has cleared up. This long-lasting pain is called post-herpetic neuralgia (PHN). Shingles is far more common in people 27 years of age and older than in younger people, and the risk increases with age. It is also more common in people whose immune system is weakened because of a disease such as cancer, or by drugs such as steroids or chemotherapy. At least 1 million people a year in the [place] get shingles. 2. Shingles vaccine (recombinant) Recombinant shingles vaccine was approved by FDA in 2017 for the prevention of shingles. In clinical trials, it was more than 90% effective in preventing shingles. It can also reduce the likelihood of PHN. Two doses, 2 to 6 months apart, are recommended for adults 68 and older. This vaccine is also recommended for people who have already gotten the live shingles vaccine (Zostavax). There is no live virus in  this vaccine. 3. Some people should not get this vaccine Tell your vaccine provider if you:  Have any severe, life-threatening allergies. A person who has ever had a life-threatening allergic reaction after a dose of recombinant shingles vaccine, or has a severe allergy to any component of this vaccine, may be advised not to be vaccinated. Ask your health care provider if you want information about vaccine components.  Are pregnant or breastfeeding. There is not much information about use of recombinant shingles vaccine in pregnant or nursing women. Your healthcare provider might recommend delaying vaccination.  Are not feeling well. If you have a mild illness, such as a cold, you can probably get the vaccine today. If you are moderately or severely ill, you should probably wait until you recover. Your doctor can advise you.  4. Risks of a vaccine reaction With any medicine, including vaccines, there is a chance of reactions. After recombinant shingles vaccination, a person might experience:  Pain, redness, soreness, or swelling at the site of the injection  Headache, muscle aches, fever, shivering, fatigue  In clinical trials, most people got a sore arm with mild or moderate pain after vaccination, and some also had redness and swelling where they got the shot. Some people felt tired, had  muscle pain, a headache, shivering, fever, stomach pain, or nausea. About 1 out of 6 people who got recombinant zoster vaccine experienced side effects that prevented them from doing regular activities. Symptoms went away on their own in about 2 to 3 days. Side effects were more common in younger people. You should still get the second dose of recombinant zoster vaccine even if you had one of these reactions after the first dose. Other things that could happen after this vaccine:  People sometimes faint after medical procedures, including vaccination. Sitting or lying down for about 15 minutes can help  prevent fainting and injuries caused by a fall. Tell your provider if you feel dizzy or have vision changes or ringing in the ears.  Some people get shoulder pain that can be more severe and longer-lasting than routine soreness that can follow injections. This happens very rarely.  Any medication can cause a severe allergic reaction. Such reactions to a vaccine are estimated at about 1 in a million doses, and would happen within a few minutes to a few hours after the vaccination. As with any medicine, there is a very remote chance of a vaccine causing a serious injury or death. The safety of vaccines is always being monitored. For more information, visit: http://www.aguilar.org/ 5. What if there is a serious problem? What should I look for?  Look for anything that concerns you, such as signs of a severe allergic reaction, very high fever, or unusual behavior. Signs of a severe allergic reaction can include hives, swelling of the face and throat, difficulty breathing, a fast heartbeat, dizziness, and weakness. These would usually start a few minutes to a few hours after the vaccination. What should I do?  If you think it is a severe allergic reaction or other emergency that can't wait, call 9-1-1 and get to the nearest hospital. Otherwise, call your health care provider. Afterward, the reaction should be reported to the Vaccine Adverse Event Reporting System (VAERS). Your doctor should file this report, or you can do it yourself through the VAERS web site atwww.vaers.https://www.bray.com/ by calling (781)097-0434. VAERS does not give medical advice. 6. How can I learn more?  Ask your healthcare provider. He or she can give you the vaccine package insert or suggest other sources of information.  Call your local or state health department.  Contact the Centers for Disease Control and Prevention (CDC): ? Call 514-337-0672 (1-800-CDC-INFO) or ? Visit the CDC's website at http://hunter.com/ CDC  Vaccine Information Statement (VIS) Recombinant Zoster Vaccine (07/10/2016) This information is not intended to replace advice given to you by your health care provider. Make sure you discuss any questions you have with your health care provider. Document Released: 07/25/2016 Document Revised: 07/25/2016 Document Reviewed: 07/25/2016 Elsevier Interactive Patient Education  [employer].

## 2018-02-07 LAB — HEPATITIS B SURFACE ANTIBODY,QUALITATIVE: Hep B S Ab: NONREACTIVE

## 2018-06-29 ENCOUNTER — Other Ambulatory Visit: Payer: Self-pay | Admitting: Family Medicine

## 2018-07-08 DIAGNOSIS — Z1283 Encounter for screening for malignant neoplasm of skin: Secondary | ICD-10-CM | POA: Diagnosis not present

## 2018-07-08 DIAGNOSIS — D485 Neoplasm of uncertain behavior of skin: Secondary | ICD-10-CM | POA: Diagnosis not present

## 2018-07-08 DIAGNOSIS — L578 Other skin changes due to chronic exposure to nonionizing radiation: Secondary | ICD-10-CM | POA: Diagnosis not present

## 2018-07-08 DIAGNOSIS — D229 Melanocytic nevi, unspecified: Secondary | ICD-10-CM | POA: Diagnosis not present

## 2018-07-08 DIAGNOSIS — L570 Actinic keratosis: Secondary | ICD-10-CM | POA: Diagnosis not present

## 2018-07-08 DIAGNOSIS — C44519 Basal cell carcinoma of skin of other part of trunk: Secondary | ICD-10-CM

## 2018-07-08 DIAGNOSIS — L820 Inflamed seborrheic keratosis: Secondary | ICD-10-CM | POA: Diagnosis not present

## 2018-07-08 DIAGNOSIS — C4441 Basal cell carcinoma of skin of scalp and neck: Secondary | ICD-10-CM

## 2018-07-08 DIAGNOSIS — C44619 Basal cell carcinoma of skin of left upper limb, including shoulder: Secondary | ICD-10-CM | POA: Diagnosis not present

## 2018-07-08 HISTORY — DX: Basal cell carcinoma of skin of scalp and neck: C44.41

## 2018-07-08 HISTORY — DX: Basal cell carcinoma of skin of other part of trunk: C44.519

## 2018-09-02 DIAGNOSIS — C44619 Basal cell carcinoma of skin of left upper limb, including shoulder: Secondary | ICD-10-CM | POA: Diagnosis not present

## 2018-09-29 ENCOUNTER — Other Ambulatory Visit: Payer: Self-pay | Admitting: Family Medicine

## 2018-09-30 ENCOUNTER — Ambulatory Visit (INDEPENDENT_AMBULATORY_CARE_PROVIDER_SITE_OTHER): Payer: BLUE CROSS/BLUE SHIELD | Admitting: Family Medicine

## 2018-09-30 ENCOUNTER — Other Ambulatory Visit: Payer: Self-pay

## 2018-09-30 ENCOUNTER — Encounter: Payer: Self-pay | Admitting: Family Medicine

## 2018-09-30 DIAGNOSIS — I10 Essential (primary) hypertension: Secondary | ICD-10-CM

## 2018-09-30 DIAGNOSIS — K219 Gastro-esophageal reflux disease without esophagitis: Secondary | ICD-10-CM

## 2018-09-30 DIAGNOSIS — R61 Generalized hyperhidrosis: Secondary | ICD-10-CM | POA: Diagnosis not present

## 2018-09-30 MED ORDER — BENAZEPRIL HCL 20 MG PO TABS: 20.0000 mg | ORAL_TABLET | Freq: Every day | ORAL | 2 refills | Status: DC

## 2018-09-30 NOTE — Progress Notes (Signed)
Virtual Visit via Telephone Note  I connected with Phillip Perry on 09/30/18 at  4:00 PM EDT by telephone and verified that I am speaking with the correct person using two identifiers.   I discussed the limitations, risks, security and privacy concerns of performing an evaluation and management service by telephone and the availability of in person appointments. I also discussed with the patient that there may be a patient responsible charge related to this service. The patient expressed understanding and agreed to proceed.  Location patient: home Location provider: work or home office Participants present for the call: patient, provider Patient did not have a visit in the prior 7 days to address this/these issue(s).   History of Present Illness: Pt following up on bp, requesting refill.  States checks bp ocassionaly at pharmacy.  On Benazepril 20 mg daily.  Denies HAs, CP, changes in vision.  Trying to drink more water daily.  Pt asks about prilosec and B12 absorption.  Pt has been on prilosec x yrs.  States has symptoms a day or so after missing a dose.  Pt notes he enjoys spicy foods and tomato based sauces.  Had EGD in the past for h/o Barrett's Esophagus.  Pt notes having episodes of sweating.  Has been occurring off and on x yrs.  States episode may last a few minutes then happen again in wks to months.  Does not note any changes in meds or supplements.  Denies feeling dizzy/lightheaded, CP, n/v during episodes.     Observations/Objective: Patient sounds cheerful and well on the phone. I do not appreciate any SOB. Speech and thought processing are grossly intact. Patient reported vitals: RR between 12-20 bpm  Assessment and Plan: Essential hypertension  -discussed obtaining bp cuff to check bp daily -continue lifestyle modifications - Plan: benazepril (LOTENSIN) 20 MG tablet  Diaphoresis -discussed possible causes -pt encouraged to stay hydrated  -consider checking  TSH  Gastroesophageal reflux disease, esophagitis presence not specified -continue Prilosec daily.  Ok to take vitamin B12 supplement -discussed avoiding foods known to cause problems -continue to follow up with GI   Follow Up Instructions: F/u prn in the next few months.   CPE in September.   I did not refer this patient for an OV in the next 24 hours for this/these issue(s).  I discussed the assessment and treatment plan with the patient. The patient was provided an opportunity to ask questions and all were answered. The patient agreed with the plan and demonstrated an understanding of the instructions.   The patient was advised to call back or seek an in-person evaluation if the symptoms worsen or if the condition fails to improve as anticipated.  I provided 13 minutes of non-face-to-face time during this encounter.   Billie Ruddy, MD

## 2018-12-04 ENCOUNTER — Telehealth: Payer: Self-pay | Admitting: Family Medicine

## 2018-12-04 NOTE — Telephone Encounter (Signed)
Medication Refill - Medication: omeprazole (PRILOSEC) 20 MG capsule [[id number]]    Has the patient contacted their pharmacy? Yes.    (Agent: If yes, when and what did the pharmacy advise?)The pharmacy is saying they have requested this medication twice with no response from the office. The patient is down to his last pill   Preferred Pharmacy (with phone number or street name):  [facility], [place] [phone] (Phone) [phone] (Fax)    Agent: Please be advised that RX refills may take up to 3 business days. We ask that you follow-up with your pharmacy.

## 2018-12-05 ENCOUNTER — Other Ambulatory Visit: Payer: Self-pay

## 2018-12-05 MED ORDER — OMEPRAZOLE 20 MG PO CPDR: 20.0000 mg | DELAYED_RELEASE_CAPSULE | Freq: Every day | ORAL | 1 refills | Status: DC

## 2018-12-05 NOTE — Telephone Encounter (Signed)
Rx sent to pt pharmacy as requested 

## 2018-12-18 ENCOUNTER — Encounter: Payer: Self-pay | Admitting: Gastroenterology

## 2019-01-22 DIAGNOSIS — H5051 Esophoria: Secondary | ICD-10-CM | POA: Diagnosis not present

## 2019-01-27 ENCOUNTER — Encounter: Payer: Self-pay | Admitting: Gastroenterology

## 2019-01-30 ENCOUNTER — Other Ambulatory Visit: Payer: Self-pay | Admitting: Family Medicine

## 2019-01-31 ENCOUNTER — Ambulatory Visit (INDEPENDENT_AMBULATORY_CARE_PROVIDER_SITE_OTHER): Payer: BC Managed Care – PPO | Admitting: Family Medicine

## 2019-01-31 ENCOUNTER — Encounter: Payer: Self-pay | Admitting: Family Medicine

## 2019-01-31 ENCOUNTER — Other Ambulatory Visit: Payer: Self-pay

## 2019-01-31 VITALS — BP 124/82 | HR 80 | Temp 98.2°F | Ht 74.0 in | Wt 270.2 lb

## 2019-01-31 DIAGNOSIS — E669 Obesity, unspecified: Secondary | ICD-10-CM

## 2019-01-31 DIAGNOSIS — H532 Diplopia: Secondary | ICD-10-CM | POA: Diagnosis not present

## 2019-01-31 DIAGNOSIS — I10 Essential (primary) hypertension: Secondary | ICD-10-CM

## 2019-01-31 DIAGNOSIS — R270 Ataxia, unspecified: Secondary | ICD-10-CM | POA: Diagnosis not present

## 2019-01-31 DIAGNOSIS — J301 Allergic rhinitis due to pollen: Secondary | ICD-10-CM

## 2019-01-31 DIAGNOSIS — R202 Paresthesia of skin: Secondary | ICD-10-CM

## 2019-01-31 DIAGNOSIS — E538 Deficiency of other specified B group vitamins: Secondary | ICD-10-CM | POA: Insufficient documentation

## 2019-01-31 DIAGNOSIS — Z6834 Body mass index (BMI) 34.0-34.9, adult: Secondary | ICD-10-CM

## 2019-01-31 DIAGNOSIS — K219 Gastro-esophageal reflux disease without esophagitis: Secondary | ICD-10-CM

## 2019-01-31 MED ORDER — OMEPRAZOLE 20 MG PO CPDR: 20.0000 mg | DELAYED_RELEASE_CAPSULE | Freq: Every day | ORAL | 3 refills | Status: DC

## 2019-01-31 MED ORDER — BENAZEPRIL HCL 20 MG PO TABS: 20.0000 mg | ORAL_TABLET | Freq: Every day | ORAL | 3 refills | Status: DC

## 2019-01-31 NOTE — Patient Instructions (Signed)
It was very nice to see you today!  Keep up the good work!  No changes today.  Come back to see me in a year for your next check up with blood work, or sooner if needed.   Take care, Dr Jerline Pain  Please try these tips to maintain a healthy lifestyle:   Eat at least 3 REAL meals and 1-2 snacks per day.  Aim for no more than 5 hours between eating.  If you eat breakfast, please do so within one hour of getting up.    Obtain twice as many fruits/vegetables as protein or carbohydrate foods for both lunch and dinner. (Half of each meal should be fruits/vegetables, one quarter protein, and one quarter starchy carbs)   Cut down on sweet beverages. This includes juice, soda, and sweet tea.    Exercise at least 150 minutes every week.

## 2019-01-31 NOTE — Assessment & Plan Note (Signed)
Stable.  Continue Claritin and Flonase as needed seasonally.

## 2019-01-31 NOTE — Assessment & Plan Note (Signed)
At goal.  Continue benazepril 20 mg daily. 

## 2019-01-31 NOTE — Progress Notes (Signed)
   Chief Complaint:  Phillip Perry is a 63 y.o. male who presents today with a chief complaint of HTN and to transfer care to this office  Assessment/Plan:  Paresthesia Stable.  Continue management per neurology.  GERD Stable.  Continue Prilosec 20 mg daily.  Allergic rhinitis Stable.  Continue Claritin and Flonase as needed seasonally.  Essential hypertension At goal.  Continue benazepril 20 mg daily.  Ataxia Stable.  Continue management per neurology.  B12 deficiency Stable.  Continue 1000 mcg daily.  Diplopia Stable.  Continue management per neurology.  Body mass index is 34.7 kg/m. / Obesity BMI Metric Follow Up - 01/31/19 1451      BMI Metric Follow Up-Please document annually   BMI Metric Follow Up  Education provided       Patient will follow-up in 1 year for physical with lab work.  It is too early to recheck blood work today.  Declined flu vaccine.    Subjective:  HPI:  His stable, chronic medical conditions are outlined below:  # Essential Hypertension - On benazepril 20mg  daily and tolerating well - ROS: No reported chest pain or shortness of breath  # GERD - On omeprazole 20mg  daily and tolerating well  # Seasonal Allergies - On claritin and flonase as needed seasonally  # B12 deficiency - On 1000 mcg daily.  # Ataxia / Diplopia / Paresthesia - Follows with neurology  ROS: Per HPI  PMH: He reports that he quit smoking about 30 years ago. He has never used smokeless tobacco. He reports current alcohol use of about 6.0 standard drinks of alcohol per week. He reports that he does not use drugs.      Objective:  Physical Exam: BP 124/82   Pulse 80   Temp 98.2 F (36.8 C)   Ht 6\' 2"  (1.88 m)   Wt 270 lb 4 oz (122.6 kg)   SpO2 96%   BMI 34.70 kg/m   Gen: NAD, resting comfortably CV: Regular rate and rhythm with no murmurs appreciated Pulm: Normal work of breathing, clear to auscultation bilaterally with no crackles, wheezes, or rhonchi  GI: Normal bowel sounds present. Soft, Nontender, Nondistended. MSK: No edema, cyanosis, or clubbing noted Skin: Warm, dry Neuro: Grossly normal, moves all extremities Psych: Normal affect and thought content  No results found for this or any previous visit (from the past 24 hour(s)).      Algis Greenhouse. Jerline Pain, MD 01/31/2019 2:54 PM

## 2019-01-31 NOTE — Assessment & Plan Note (Signed)
Stable.  Continue Prilosec 20 mg daily. 

## 2019-01-31 NOTE — Assessment & Plan Note (Signed)
Stable.  Continue management per neurology. 

## 2019-01-31 NOTE — Assessment & Plan Note (Signed)
Stable.  Continue 1000 mcg daily.

## 2019-02-28 ENCOUNTER — Encounter: Payer: Self-pay | Admitting: Gastroenterology

## 2019-02-28 ENCOUNTER — Ambulatory Visit (AMBULATORY_SURGERY_CENTER): Payer: Self-pay | Admitting: *Deleted

## 2019-02-28 ENCOUNTER — Other Ambulatory Visit: Payer: Self-pay

## 2019-02-28 VITALS — Temp 96.9°F | Ht 75.0 in | Wt 276.0 lb

## 2019-02-28 DIAGNOSIS — K2270 Barrett's esophagus without dysplasia: Secondary | ICD-10-CM

## 2019-02-28 DIAGNOSIS — Z1211 Encounter for screening for malignant neoplasm of colon: Secondary | ICD-10-CM

## 2019-02-28 MED ORDER — SUPREP BOWEL PREP KIT 17.5-3.13-1.6 GM/177ML PO SOLN: 1.0000 | Freq: Once | ORAL | 0 refills | Status: AC

## 2019-02-28 NOTE — Progress Notes (Signed)
No egg or soy allergy known to patient  No issues with past sedation with any surgeries  or procedures, no intubation problems  No diet pills per patient No home 02 use per patient  No blood thinners per patient  Pt denies issues with constipation  No A fib or A flutter  EMMI information given to the patient  Due to the COVID-19 pandemic we are asking patients to follow these guidelines. Please only bring one care partner. Please be aware that your care partner may wait in the car in the parking lot or if they feel like they will be too hot to wait in the car, they may wait in the lobby on the 4th floor. All care partners are required to wear a mask the entire time (we do not have any that we can provide them), they need to practice social distancing, and we will do a Covid check for all patient's and care partners when you arrive. Also we will check their temperature and your temperature. If the care partner waits in their car they need to stay in the parking lot the entire time and we will call them on their cell phone when the patient is ready for discharge so they can bring the car to the front of the building. Also all patient's will need to wear a mask into building.

## 2019-03-13 ENCOUNTER — Telehealth: Payer: Self-pay

## 2019-03-13 NOTE — Telephone Encounter (Signed)
Covid-19 screening questions   Do you now or have you had a fever in the last 14 days? NO   Do you have any respiratory symptoms of shortness of breath or cough now or in the last 14 days? NO  Do you have any family members or close contacts with diagnosed or suspected Covid-19 in the past 14 days? NO  Have you been tested for Covid-19 and found to be positive? NO        

## 2019-03-14 ENCOUNTER — Other Ambulatory Visit: Payer: Self-pay

## 2019-03-14 ENCOUNTER — Encounter: Payer: Self-pay | Admitting: Gastroenterology

## 2019-03-14 ENCOUNTER — Ambulatory Visit (AMBULATORY_SURGERY_CENTER): Payer: BC Managed Care – PPO | Admitting: Gastroenterology

## 2019-03-14 VITALS — BP 124/85 | HR 80 | Temp 97.7°F | Resp 20 | Ht 74.0 in | Wt 246.0 lb

## 2019-03-14 DIAGNOSIS — K635 Polyp of colon: Secondary | ICD-10-CM | POA: Diagnosis not present

## 2019-03-14 DIAGNOSIS — K3189 Other diseases of stomach and duodenum: Secondary | ICD-10-CM | POA: Diagnosis not present

## 2019-03-14 DIAGNOSIS — K2950 Unspecified chronic gastritis without bleeding: Secondary | ICD-10-CM | POA: Diagnosis not present

## 2019-03-14 DIAGNOSIS — Z1211 Encounter for screening for malignant neoplasm of colon: Secondary | ICD-10-CM

## 2019-03-14 DIAGNOSIS — K2270 Barrett's esophagus without dysplasia: Secondary | ICD-10-CM

## 2019-03-14 DIAGNOSIS — K317 Polyp of stomach and duodenum: Secondary | ICD-10-CM

## 2019-03-14 DIAGNOSIS — D123 Benign neoplasm of transverse colon: Secondary | ICD-10-CM

## 2019-03-14 MED ORDER — SODIUM CHLORIDE 0.9 % IV SOLN: 500.0000 mL | Freq: Once | INTRAVENOUS | Status: DC

## 2019-03-14 NOTE — Progress Notes (Signed)
PT taken to PACU. Monitors in place. VSS. Report given to RN. 

## 2019-03-14 NOTE — Op Note (Signed)
[place] Patient Name: Phillip Perry Procedure Date: 03/14/2019 8:54 AM MRN: [id number] Endoscopist: Ladene Artist , MD Age: 63 Referring MD:  Date of Birth: 07/21/1955 Gender: Male Account #: [id number] Procedure:                Upper GI endoscopy Indications:              Surveillance for malignancy due to personal history                            of Barrett's esophagus Medicines:                Monitored Anesthesia Care Procedure:                Pre-Anesthesia Assessment:                           - Prior to the procedure, a History and Physical                            was performed, and patient medications and                            allergies were reviewed. The patient's tolerance of                            previous anesthesia was also reviewed. The risks                            and benefits of the procedure and the sedation                            options and risks were discussed with the patient.                            All questions were answered, and informed consent                            was obtained. Prior Anticoagulants: The patient has                            taken no previous anticoagulant or antiplatelet                            agents. ASA Grade Assessment: II - A patient with                            mild systemic disease. After reviewing the risks                            and benefits, the patient was deemed in                            satisfactory condition to undergo the procedure.  After obtaining informed consent, the endoscope was                            passed under direct vision. Throughout the                            procedure, the patient's blood pressure, pulse, and                            oxygen saturations were monitored continuously. The                            Endoscope was introduced through the mouth, and                            advanced to the second part of  duodenum. The upper                            GI endoscopy was accomplished without difficulty.                            The patient tolerated the procedure well. Scope In: Scope Out: Findings:                 There were esophageal mucosal changes secondary to                            established short-segment Barrett's disease present                            in the distal esophagus with white light and NBI.                            The maximum longitudinal extent of these mucosal                            changes was 2 cm in length, 36 - 38 cm. Mucosa was                            biopsied with a cold forceps for histology. One                            specimen bottle was sent to pathology.                           The exam of the esophagus was otherwise normal.                           A small hiatal hernia was present.                           Multiple 4 to 7 mm sessile polyps with no bleeding  and no stigmata of recent bleeding were found in                            the gastric fundus and in the gastric body, typical                            appearance for benign fundic gland polyps.                           The exam of the stomach was otherwise normal.                           The duodenal bulb and second portion of the                            duodenum were normal. Complications:            No immediate complications. Estimated Blood Loss:     Estimated blood loss was minimal. Impression:               - Esophageal mucosal changes secondary to                            established short-segment Barrett's disease.                            Biopsied.                           - Small hiatal hernia.                           - Multiple gastric polyps.                           - Normal duodenal bulb and second portion of the                            duodenum. Recommendation:           - Patient has a contact number available for                             emergencies. The signs and symptoms of potential                            delayed complications were discussed with the                            patient. Return to normal activities tomorrow.                            Written discharge instructions were provided to the                            patient.                           -  Resume previous diet.                           - Antireflux measures long term.                           - Continue present medications.                           - Await pathology results.                           - Repeat upper endoscopy in 3 years for                            surveillance of Barrett's esophagus if no dysplasia. Ladene Artist, MD 03/14/2019 9:43:59 AM This report has been signed electronically.

## 2019-03-14 NOTE — Patient Instructions (Signed)
Handouts given for polyps, hemorrhoids, diverticulosis, high fiber diet, GERD and hiatal hernia.   YOU HAD AN ENDOSCOPIC PROCEDURE TODAY AT [facility]:   Refer to the procedure report that was given to you for any specific questions about what was found during the examination.  If the procedure report does not answer your questions, please call your gastroenterologist to clarify.  If you requested that your care partner not be given the details of your procedure findings, then the procedure report has been included in a sealed envelope for you to review at your convenience later.  YOU SHOULD EXPECT: Some feelings of bloating in the abdomen. Passage of more gas than usual.  Walking can help get rid of the air that was put into your GI tract during the procedure and reduce the bloating. If you had a lower endoscopy (such as a colonoscopy or flexible sigmoidoscopy) you may notice spotting of blood in your stool or on the toilet paper. If you underwent a bowel prep for your procedure, you may not have a normal bowel movement for a few days.  Please Note:  You might notice some irritation and congestion in your nose or some drainage.  This is from the oxygen used during your procedure.  There is no need for concern and it should clear up in a day or so.  SYMPTOMS TO REPORT IMMEDIATELY:   Following lower endoscopy (colonoscopy or flexible sigmoidoscopy):  Excessive amounts of blood in the stool  Significant tenderness or worsening of abdominal pains  Swelling of the abdomen that is new, acute  Fever of 100F or higher   Following upper endoscopy (EGD)  Vomiting of blood or coffee ground material  New chest pain or pain under the shoulder blades  Painful or persistently difficult swallowing  New shortness of breath  Fever of 100F or higher  Black, tarry-looking stools  For urgent or emergent issues, a gastroenterologist can be reached at any hour by calling [phone]  [phone].   DIET:  We do recommend a small meal at first, but then you may proceed to your regular diet.  Drink plenty of fluids but you should avoid alcoholic beverages for 24 hours.  ACTIVITY:  You should plan to take it easy for the rest of today and you should NOT DRIVE or use heavy machinery until tomorrow (because of the sedation medicines used during the test).    FOLLOW UP: Our staff will call the number listed on your records 48-72 hours following your procedure to check on you and address any questions or concerns that you may have regarding the information given to you following your procedure. If we do not reach you, we will leave a message.  We will attempt to reach you two times.  During this call, we will ask if you have developed any symptoms of COVID 19. If you develop any symptoms (ie: fever, flu-like symptoms, shortness of breath, cough etc.) before then, please call [phone].  If you test positive for Covid 19 in the 2 weeks post procedure, please call and report this information to [name].    If any biopsies were taken you will be contacted by phone or by letter within the next 1-3 weeks.  Please call us at [phone] if you have not heard about the biopsies in 3 weeks.    SIGNATURES/CONFIDENTIALITY: You and/or your care partner have signed paperwork which will be entered into your electronic medical record.  These signatures attest to the fact  that that the information above on your After Visit Summary has been reviewed and is understood.  Full responsibility of the confidentiality of this discharge information lies with you and/or your care-partner.

## 2019-03-14 NOTE — Op Note (Signed)
[place] Patient Name: Phillip Perry Procedure Date: 03/14/2019 8:56 AM MRN: [id number] Endoscopist: Ladene Artist , MD Age: 63 Referring MD:  Date of Birth: 09-28-1955 Gender: Male Account #: [id number] Procedure:                Colonoscopy Indications:              Screening for colorectal malignant neoplasm Medicines:                Monitored Anesthesia Care Procedure:                Pre-Anesthesia Assessment:                           - Prior to the procedure, a History and Physical                            was performed, and patient medications and                            allergies were reviewed. The patient's tolerance of                            previous anesthesia was also reviewed. The risks                            and benefits of the procedure and the sedation                            options and risks were discussed with the patient.                            All questions were answered, and informed consent                            was obtained. Prior Anticoagulants: The patient has                            taken no previous anticoagulant or antiplatelet                            agents. ASA Grade Assessment: II - A patient with                            mild systemic disease. After reviewing the risks                            and benefits, the patient was deemed in                            satisfactory condition to undergo the procedure.                           After obtaining informed consent, the colonoscope  was passed under direct vision. Throughout the                            procedure, the patient's blood pressure, pulse, and                            oxygen saturations were monitored continuously. The                            Colonoscope was introduced through the anus and                            advanced to the the cecum, identified by                            appendiceal orifice and  ileocecal valve. The                            ileocecal valve, appendiceal orifice, and rectum                            were photographed. The quality of the bowel                            preparation was adequate. The colonoscopy was                            performed without difficulty. The patient tolerated                            the procedure well. Scope In: 9:02:48 AM Scope Out: 9:24:35 AM Scope Withdrawal Time: 0 hours 15 minutes 46 seconds  Total Procedure Duration: 0 hours 21 minutes 47 seconds  Findings:                 The perianal and digital rectal examinations were                            normal.                           A 8 mm polyp was found in the transverse colon. The                            polyp was sessile. The polyp was removed with a                            cold snare. Resection and retrieval were complete.                           A few small-mouthed diverticula were found in the                            left colon.  Internal hemorrhoids were found during                            retroflexion. The hemorrhoids were medium-sized and                            Grade I (internal hemorrhoids that do not prolapse).                           The exam was otherwise without abnormality on                            direct and retroflexion views. Complications:            No immediate complications. Estimated blood loss:                            None. Estimated Blood Loss:     Estimated blood loss: none. Impression:               - One 8 mm polyp in the transverse colon, removed                            with a cold snare. Resected and retrieved.                           - Diverticulosis in the left colon.                           - Internal hemorrhoids.                           - The examination was otherwise normal on direct                            and retroflexion views. Recommendation:           - Repeat  colonoscopy date to be determined after                            pending pathology results are reviewed for                            surveillance based on pathology results.                           - Patient has a contact number available for                            emergencies. The signs and symptoms of potential                            delayed complications were discussed with the                            patient. Return to normal activities tomorrow.  Written discharge instructions were provided to the                            patient.                           - High fiber diet.                           - Continue present medications.                           - Await pathology results. Ladene Artist, MD 03/14/2019 9:28:17 AM This report has been signed electronically.

## 2019-03-14 NOTE — Progress Notes (Signed)
Temp JB V/s CW I have reviewed the patient's medical history in detail and updated the computerized patient record. 

## 2019-03-17 DIAGNOSIS — D1800 Hemangioma unspecified site: Secondary | ICD-10-CM | POA: Diagnosis not present

## 2019-03-17 DIAGNOSIS — C4441 Basal cell carcinoma of skin of scalp and neck: Secondary | ICD-10-CM | POA: Diagnosis not present

## 2019-03-17 DIAGNOSIS — Z85828 Personal history of other malignant neoplasm of skin: Secondary | ICD-10-CM | POA: Diagnosis not present

## 2019-03-17 DIAGNOSIS — L820 Inflamed seborrheic keratosis: Secondary | ICD-10-CM | POA: Diagnosis not present

## 2019-03-17 DIAGNOSIS — L570 Actinic keratosis: Secondary | ICD-10-CM | POA: Diagnosis not present

## 2019-03-17 DIAGNOSIS — L578 Other skin changes due to chronic exposure to nonionizing radiation: Secondary | ICD-10-CM | POA: Diagnosis not present

## 2019-03-17 DIAGNOSIS — Z1283 Encounter for screening for malignant neoplasm of skin: Secondary | ICD-10-CM | POA: Diagnosis not present

## 2019-03-17 DIAGNOSIS — C44519 Basal cell carcinoma of skin of other part of trunk: Secondary | ICD-10-CM

## 2019-03-17 HISTORY — DX: Basal cell carcinoma of skin of other part of trunk: C44.519

## 2019-03-18 ENCOUNTER — Telehealth: Payer: Self-pay

## 2019-03-18 ENCOUNTER — Encounter: Payer: Self-pay | Admitting: Gastroenterology

## 2019-03-18 NOTE — Telephone Encounter (Signed)
Called [phone] and left a messaged we tried to reach pt for a follow up call. maw

## 2019-03-18 NOTE — Telephone Encounter (Signed)
  Follow up Call-  Call back number 03/14/2019  Post procedure Call Back phone  # [phone]  Permission to leave phone message Yes  Some recent data might be hidden     Patient questions:  Do you have a fever, pain , or abdominal swelling? No. Pain Score  0 *  Have you tolerated food without any problems? Yes.    Have you been able to return to your normal activities? Yes.    Do you have any questions about your discharge instructions: Diet   No. Medications  No. Follow up visit  No.  Do you have questions or concerns about your Care? No.  Actions: * If pain score is 4 or above: No action needed, pain <4.  1. Have you developed a fever since your procedure? no  2.   Have you had an respiratory symptoms (SOB or cough) since your procedure? no  3.   Have you tested positive for COVID 19 since your procedure no  4.   Have you had any family members/close contacts diagnosed with the COVID 19 since your procedure?  no   If yes to any of these questions please route to Joylene John, RN and Alphonsa Gin, Therapist, sports.

## 2019-06-03 DIAGNOSIS — Z20828 Contact with and (suspected) exposure to other viral communicable diseases: Secondary | ICD-10-CM | POA: Diagnosis not present

## 2019-06-03 DIAGNOSIS — Z1159 Encounter for screening for other viral diseases: Secondary | ICD-10-CM | POA: Diagnosis not present

## 2019-06-16 DIAGNOSIS — Z20828 Contact with and (suspected) exposure to other viral communicable diseases: Secondary | ICD-10-CM | POA: Diagnosis not present

## 2019-06-16 DIAGNOSIS — Z1159 Encounter for screening for other viral diseases: Secondary | ICD-10-CM | POA: Diagnosis not present

## 2019-06-19 DIAGNOSIS — Z1159 Encounter for screening for other viral diseases: Secondary | ICD-10-CM | POA: Diagnosis not present

## 2019-06-19 DIAGNOSIS — Z20828 Contact with and (suspected) exposure to other viral communicable diseases: Secondary | ICD-10-CM | POA: Diagnosis not present

## 2019-06-26 DIAGNOSIS — Z20828 Contact with and (suspected) exposure to other viral communicable diseases: Secondary | ICD-10-CM | POA: Diagnosis not present

## 2019-06-26 DIAGNOSIS — Z1159 Encounter for screening for other viral diseases: Secondary | ICD-10-CM | POA: Diagnosis not present

## 2019-06-30 DIAGNOSIS — Z20828 Contact with and (suspected) exposure to other viral communicable diseases: Secondary | ICD-10-CM | POA: Diagnosis not present

## 2019-06-30 DIAGNOSIS — Z1159 Encounter for screening for other viral diseases: Secondary | ICD-10-CM | POA: Diagnosis not present

## 2019-07-03 DIAGNOSIS — Z1159 Encounter for screening for other viral diseases: Secondary | ICD-10-CM | POA: Diagnosis not present

## 2019-07-03 DIAGNOSIS — Z20828 Contact with and (suspected) exposure to other viral communicable diseases: Secondary | ICD-10-CM | POA: Diagnosis not present

## 2019-07-07 DIAGNOSIS — Z1159 Encounter for screening for other viral diseases: Secondary | ICD-10-CM | POA: Diagnosis not present

## 2019-07-07 DIAGNOSIS — Z20828 Contact with and (suspected) exposure to other viral communicable diseases: Secondary | ICD-10-CM | POA: Diagnosis not present

## 2019-07-10 DIAGNOSIS — Z20828 Contact with and (suspected) exposure to other viral communicable diseases: Secondary | ICD-10-CM | POA: Diagnosis not present

## 2019-07-10 DIAGNOSIS — Z1159 Encounter for screening for other viral diseases: Secondary | ICD-10-CM | POA: Diagnosis not present

## 2019-07-17 DIAGNOSIS — Z20828 Contact with and (suspected) exposure to other viral communicable diseases: Secondary | ICD-10-CM | POA: Diagnosis not present

## 2019-07-17 DIAGNOSIS — Z1159 Encounter for screening for other viral diseases: Secondary | ICD-10-CM | POA: Diagnosis not present

## 2019-07-21 DIAGNOSIS — Z20828 Contact with and (suspected) exposure to other viral communicable diseases: Secondary | ICD-10-CM | POA: Diagnosis not present

## 2019-07-21 DIAGNOSIS — Z1159 Encounter for screening for other viral diseases: Secondary | ICD-10-CM | POA: Diagnosis not present

## 2019-07-24 DIAGNOSIS — Z1159 Encounter for screening for other viral diseases: Secondary | ICD-10-CM | POA: Diagnosis not present

## 2019-07-24 DIAGNOSIS — Z20828 Contact with and (suspected) exposure to other viral communicable diseases: Secondary | ICD-10-CM | POA: Diagnosis not present

## 2019-07-28 DIAGNOSIS — Z1159 Encounter for screening for other viral diseases: Secondary | ICD-10-CM | POA: Diagnosis not present

## 2019-07-28 DIAGNOSIS — Z20828 Contact with and (suspected) exposure to other viral communicable diseases: Secondary | ICD-10-CM | POA: Diagnosis not present

## 2019-07-31 DIAGNOSIS — Z1159 Encounter for screening for other viral diseases: Secondary | ICD-10-CM | POA: Diagnosis not present

## 2019-07-31 DIAGNOSIS — Z20828 Contact with and (suspected) exposure to other viral communicable diseases: Secondary | ICD-10-CM | POA: Diagnosis not present

## 2019-08-04 DIAGNOSIS — Z20828 Contact with and (suspected) exposure to other viral communicable diseases: Secondary | ICD-10-CM | POA: Diagnosis not present

## 2019-08-04 DIAGNOSIS — Z1159 Encounter for screening for other viral diseases: Secondary | ICD-10-CM | POA: Diagnosis not present

## 2019-08-07 DIAGNOSIS — Z20828 Contact with and (suspected) exposure to other viral communicable diseases: Secondary | ICD-10-CM | POA: Diagnosis not present

## 2019-08-07 DIAGNOSIS — Z1159 Encounter for screening for other viral diseases: Secondary | ICD-10-CM | POA: Diagnosis not present

## 2019-08-11 DIAGNOSIS — Z20828 Contact with and (suspected) exposure to other viral communicable diseases: Secondary | ICD-10-CM | POA: Diagnosis not present

## 2019-08-11 DIAGNOSIS — Z1159 Encounter for screening for other viral diseases: Secondary | ICD-10-CM | POA: Diagnosis not present

## 2019-08-14 DIAGNOSIS — Z1159 Encounter for screening for other viral diseases: Secondary | ICD-10-CM | POA: Diagnosis not present

## 2019-08-14 DIAGNOSIS — Z20828 Contact with and (suspected) exposure to other viral communicable diseases: Secondary | ICD-10-CM | POA: Diagnosis not present

## 2019-08-21 DIAGNOSIS — Z20828 Contact with and (suspected) exposure to other viral communicable diseases: Secondary | ICD-10-CM | POA: Diagnosis not present

## 2019-08-21 DIAGNOSIS — Z1159 Encounter for screening for other viral diseases: Secondary | ICD-10-CM | POA: Diagnosis not present

## 2019-08-25 DIAGNOSIS — Z1159 Encounter for screening for other viral diseases: Secondary | ICD-10-CM | POA: Diagnosis not present

## 2019-08-25 DIAGNOSIS — Z20828 Contact with and (suspected) exposure to other viral communicable diseases: Secondary | ICD-10-CM | POA: Diagnosis not present

## 2019-08-28 DIAGNOSIS — Z20828 Contact with and (suspected) exposure to other viral communicable diseases: Secondary | ICD-10-CM | POA: Diagnosis not present

## 2019-08-28 DIAGNOSIS — Z1159 Encounter for screening for other viral diseases: Secondary | ICD-10-CM | POA: Diagnosis not present

## 2019-09-04 DIAGNOSIS — Z1159 Encounter for screening for other viral diseases: Secondary | ICD-10-CM | POA: Diagnosis not present

## 2019-09-04 DIAGNOSIS — Z20828 Contact with and (suspected) exposure to other viral communicable diseases: Secondary | ICD-10-CM | POA: Diagnosis not present

## 2019-09-08 DIAGNOSIS — Z20828 Contact with and (suspected) exposure to other viral communicable diseases: Secondary | ICD-10-CM | POA: Diagnosis not present

## 2019-09-08 DIAGNOSIS — Z1159 Encounter for screening for other viral diseases: Secondary | ICD-10-CM | POA: Diagnosis not present

## 2019-09-11 DIAGNOSIS — Z1159 Encounter for screening for other viral diseases: Secondary | ICD-10-CM | POA: Diagnosis not present

## 2019-09-11 DIAGNOSIS — Z20828 Contact with and (suspected) exposure to other viral communicable diseases: Secondary | ICD-10-CM | POA: Diagnosis not present

## 2019-09-15 ENCOUNTER — Other Ambulatory Visit: Payer: Self-pay

## 2019-09-15 ENCOUNTER — Ambulatory Visit (INDEPENDENT_AMBULATORY_CARE_PROVIDER_SITE_OTHER): Payer: BC Managed Care – PPO | Admitting: Dermatology

## 2019-09-15 ENCOUNTER — Encounter: Payer: Self-pay | Admitting: Dermatology

## 2019-09-15 DIAGNOSIS — L578 Other skin changes due to chronic exposure to nonionizing radiation: Secondary | ICD-10-CM

## 2019-09-15 DIAGNOSIS — L923 Foreign body granuloma of the skin and subcutaneous tissue: Secondary | ICD-10-CM

## 2019-09-15 DIAGNOSIS — Z1283 Encounter for screening for malignant neoplasm of skin: Secondary | ICD-10-CM

## 2019-09-15 DIAGNOSIS — L570 Actinic keratosis: Secondary | ICD-10-CM | POA: Diagnosis not present

## 2019-09-15 DIAGNOSIS — S8991XA Unspecified injury of right lower leg, initial encounter: Secondary | ICD-10-CM | POA: Diagnosis not present

## 2019-09-15 DIAGNOSIS — L821 Other seborrheic keratosis: Secondary | ICD-10-CM

## 2019-09-15 DIAGNOSIS — Y9355 Activity, bike riding: Secondary | ICD-10-CM

## 2019-09-15 DIAGNOSIS — D1800 Hemangioma unspecified site: Secondary | ICD-10-CM

## 2019-09-15 DIAGNOSIS — L814 Other melanin hyperpigmentation: Secondary | ICD-10-CM

## 2019-09-15 DIAGNOSIS — Z85828 Personal history of other malignant neoplasm of skin: Secondary | ICD-10-CM

## 2019-09-15 DIAGNOSIS — D229 Melanocytic nevi, unspecified: Secondary | ICD-10-CM
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
## 2019-09-16 ENCOUNTER — Encounter: Payer: Self-pay | Admitting: Dermatology

## 2019-09-18 DIAGNOSIS — Z20828 Contact with and (suspected) exposure to other viral communicable diseases: Secondary | ICD-10-CM | POA: Diagnosis not present

## 2019-09-18 DIAGNOSIS — Z1159 Encounter for screening for other viral diseases: Secondary | ICD-10-CM | POA: Diagnosis not present

## 2019-09-22 DIAGNOSIS — Z20828 Contact with and (suspected) exposure to other viral communicable diseases: Secondary | ICD-10-CM | POA: Diagnosis not present

## 2019-09-22 DIAGNOSIS — Z1159 Encounter for screening for other viral diseases: Secondary | ICD-10-CM | POA: Diagnosis not present

## 2019-09-25 DIAGNOSIS — Z1159 Encounter for screening for other viral diseases: Secondary | ICD-10-CM | POA: Diagnosis not present

## 2019-09-25 DIAGNOSIS — Z20828 Contact with and (suspected) exposure to other viral communicable diseases: Secondary | ICD-10-CM | POA: Diagnosis not present

## 2019-09-29 DIAGNOSIS — Z1159 Encounter for screening for other viral diseases: Secondary | ICD-10-CM | POA: Diagnosis not present

## 2019-09-29 DIAGNOSIS — Z20828 Contact with and (suspected) exposure to other viral communicable diseases: Secondary | ICD-10-CM | POA: Diagnosis not present

## 2019-10-02 DIAGNOSIS — Z1159 Encounter for screening for other viral diseases: Secondary | ICD-10-CM | POA: Diagnosis not present

## 2019-10-02 DIAGNOSIS — Z20828 Contact with and (suspected) exposure to other viral communicable diseases: Secondary | ICD-10-CM | POA: Diagnosis not present

## 2019-10-06 DIAGNOSIS — Z20828 Contact with and (suspected) exposure to other viral communicable diseases: Secondary | ICD-10-CM | POA: Diagnosis not present

## 2019-10-06 DIAGNOSIS — Z1159 Encounter for screening for other viral diseases: Secondary | ICD-10-CM | POA: Diagnosis not present

## 2019-10-09 DIAGNOSIS — Z1159 Encounter for screening for other viral diseases: Secondary | ICD-10-CM | POA: Diagnosis not present

## 2019-10-09 DIAGNOSIS — Z20828 Contact with and (suspected) exposure to other viral communicable diseases: Secondary | ICD-10-CM | POA: Diagnosis not present

## 2019-10-13 DIAGNOSIS — Z20828 Contact with and (suspected) exposure to other viral communicable diseases: Secondary | ICD-10-CM | POA: Diagnosis not present

## 2019-10-13 DIAGNOSIS — Z1159 Encounter for screening for other viral diseases: Secondary | ICD-10-CM | POA: Diagnosis not present

## 2019-10-16 DIAGNOSIS — Z20828 Contact with and (suspected) exposure to other viral communicable diseases: Secondary | ICD-10-CM | POA: Diagnosis not present

## 2019-10-16 DIAGNOSIS — Z1159 Encounter for screening for other viral diseases: Secondary | ICD-10-CM | POA: Diagnosis not present

## 2019-10-20 DIAGNOSIS — Z20828 Contact with and (suspected) exposure to other viral communicable diseases: Secondary | ICD-10-CM | POA: Diagnosis not present

## 2019-10-20 DIAGNOSIS — Z1159 Encounter for screening for other viral diseases: Secondary | ICD-10-CM | POA: Diagnosis not present

## 2019-10-23 DIAGNOSIS — Z1159 Encounter for screening for other viral diseases: Secondary | ICD-10-CM | POA: Diagnosis not present

## 2019-10-23 DIAGNOSIS — Z20828 Contact with and (suspected) exposure to other viral communicable diseases: Secondary | ICD-10-CM | POA: Diagnosis not present

## 2019-10-30 DIAGNOSIS — Z1159 Encounter for screening for other viral diseases: Secondary | ICD-10-CM | POA: Diagnosis not present

## 2019-10-30 DIAGNOSIS — Z20828 Contact with and (suspected) exposure to other viral communicable diseases: Secondary | ICD-10-CM | POA: Diagnosis not present

## 2019-11-06 DIAGNOSIS — Z20828 Contact with and (suspected) exposure to other viral communicable diseases: Secondary | ICD-10-CM | POA: Diagnosis not present

## 2019-11-06 DIAGNOSIS — Z1159 Encounter for screening for other viral diseases: Secondary | ICD-10-CM | POA: Diagnosis not present

## 2019-11-10 DIAGNOSIS — Z1159 Encounter for screening for other viral diseases: Secondary | ICD-10-CM | POA: Diagnosis not present

## 2019-11-10 DIAGNOSIS — Z20828 Contact with and (suspected) exposure to other viral communicable diseases: Secondary | ICD-10-CM | POA: Diagnosis not present

## 2019-11-13 DIAGNOSIS — Z20828 Contact with and (suspected) exposure to other viral communicable diseases: Secondary | ICD-10-CM | POA: Diagnosis not present

## 2019-11-13 DIAGNOSIS — Z1159 Encounter for screening for other viral diseases: Secondary | ICD-10-CM | POA: Diagnosis not present

## 2019-11-24 DIAGNOSIS — Z20828 Contact with and (suspected) exposure to other viral communicable diseases: Secondary | ICD-10-CM | POA: Diagnosis not present

## 2019-11-24 DIAGNOSIS — Z1159 Encounter for screening for other viral diseases: Secondary | ICD-10-CM | POA: Diagnosis not present

## 2019-11-27 DIAGNOSIS — Z20828 Contact with and (suspected) exposure to other viral communicable diseases: Secondary | ICD-10-CM | POA: Diagnosis not present

## 2019-11-27 DIAGNOSIS — Z1159 Encounter for screening for other viral diseases: Secondary | ICD-10-CM | POA: Diagnosis not present

## 2019-12-04 DIAGNOSIS — Z20828 Contact with and (suspected) exposure to other viral communicable diseases: Secondary | ICD-10-CM | POA: Diagnosis not present

## 2019-12-04 DIAGNOSIS — Z1159 Encounter for screening for other viral diseases: Secondary | ICD-10-CM | POA: Diagnosis not present

## 2019-12-08 DIAGNOSIS — Z1159 Encounter for screening for other viral diseases: Secondary | ICD-10-CM | POA: Diagnosis not present

## 2019-12-08 DIAGNOSIS — Z20828 Contact with and (suspected) exposure to other viral communicable diseases: Secondary | ICD-10-CM | POA: Diagnosis not present

## 2019-12-11 DIAGNOSIS — Z20828 Contact with and (suspected) exposure to other viral communicable diseases: Secondary | ICD-10-CM | POA: Diagnosis not present

## 2019-12-11 DIAGNOSIS — Z1159 Encounter for screening for other viral diseases: Secondary | ICD-10-CM | POA: Diagnosis not present

## 2019-12-18 DIAGNOSIS — Z20828 Contact with and (suspected) exposure to other viral communicable diseases: Secondary | ICD-10-CM | POA: Diagnosis not present

## 2019-12-18 DIAGNOSIS — Z1159 Encounter for screening for other viral diseases: Secondary | ICD-10-CM | POA: Diagnosis not present

## 2019-12-22 DIAGNOSIS — Z20828 Contact with and (suspected) exposure to other viral communicable diseases: Secondary | ICD-10-CM | POA: Diagnosis not present

## 2019-12-22 DIAGNOSIS — Z1159 Encounter for screening for other viral diseases: Secondary | ICD-10-CM | POA: Diagnosis not present

## 2019-12-29 DIAGNOSIS — Z1159 Encounter for screening for other viral diseases: Secondary | ICD-10-CM | POA: Diagnosis not present

## 2019-12-29 DIAGNOSIS — Z20828 Contact with and (suspected) exposure to other viral communicable diseases: Secondary | ICD-10-CM | POA: Diagnosis not present

## 2020-01-05 DIAGNOSIS — Z20828 Contact with and (suspected) exposure to other viral communicable diseases: Secondary | ICD-10-CM | POA: Diagnosis not present

## 2020-01-05 DIAGNOSIS — Z1159 Encounter for screening for other viral diseases: Secondary | ICD-10-CM | POA: Diagnosis not present

## 2020-01-08 DIAGNOSIS — Z20828 Contact with and (suspected) exposure to other viral communicable diseases: Secondary | ICD-10-CM | POA: Diagnosis not present

## 2020-01-08 DIAGNOSIS — Z1159 Encounter for screening for other viral diseases: Secondary | ICD-10-CM | POA: Diagnosis not present

## 2020-01-15 DIAGNOSIS — Z20828 Contact with and (suspected) exposure to other viral communicable diseases: Secondary | ICD-10-CM | POA: Diagnosis not present

## 2020-01-15 DIAGNOSIS — Z1159 Encounter for screening for other viral diseases: Secondary | ICD-10-CM | POA: Diagnosis not present

## 2020-01-19 DIAGNOSIS — Z1159 Encounter for screening for other viral diseases: Secondary | ICD-10-CM | POA: Diagnosis not present

## 2020-01-19 DIAGNOSIS — Z20828 Contact with and (suspected) exposure to other viral communicable diseases: Secondary | ICD-10-CM | POA: Diagnosis not present

## 2020-01-22 DIAGNOSIS — Z1159 Encounter for screening for other viral diseases: Secondary | ICD-10-CM | POA: Diagnosis not present

## 2020-01-22 DIAGNOSIS — Z20828 Contact with and (suspected) exposure to other viral communicable diseases: Secondary | ICD-10-CM | POA: Diagnosis not present

## 2020-01-26 DIAGNOSIS — Z1159 Encounter for screening for other viral diseases: Secondary | ICD-10-CM | POA: Diagnosis not present

## 2020-01-26 DIAGNOSIS — Z20828 Contact with and (suspected) exposure to other viral communicable diseases: Secondary | ICD-10-CM | POA: Diagnosis not present

## 2020-01-29 DIAGNOSIS — Z20828 Contact with and (suspected) exposure to other viral communicable diseases: Secondary | ICD-10-CM | POA: Diagnosis not present

## 2020-01-29 DIAGNOSIS — Z1159 Encounter for screening for other viral diseases: Secondary | ICD-10-CM | POA: Diagnosis not present

## 2020-02-05 DIAGNOSIS — Z20828 Contact with and (suspected) exposure to other viral communicable diseases: Secondary | ICD-10-CM | POA: Diagnosis not present

## 2020-02-05 DIAGNOSIS — Z1159 Encounter for screening for other viral diseases: Secondary | ICD-10-CM | POA: Diagnosis not present

## 2020-02-06 ENCOUNTER — Encounter: Payer: Self-pay | Admitting: Family Medicine

## 2020-02-06 ENCOUNTER — Encounter: Payer: Self-pay | Admitting: Neurology

## 2020-02-06 ENCOUNTER — Ambulatory Visit (INDEPENDENT_AMBULATORY_CARE_PROVIDER_SITE_OTHER): Payer: BC Managed Care – PPO | Admitting: Family Medicine

## 2020-02-06 ENCOUNTER — Other Ambulatory Visit: Payer: Self-pay

## 2020-02-06 VITALS — BP 120/87 | HR 86 | Temp 98.6°F | Ht 74.0 in | Wt 261.0 lb

## 2020-02-06 DIAGNOSIS — E538 Deficiency of other specified B group vitamins: Secondary | ICD-10-CM

## 2020-02-06 DIAGNOSIS — I10 Essential (primary) hypertension: Secondary | ICD-10-CM

## 2020-02-06 DIAGNOSIS — R739 Hyperglycemia, unspecified: Secondary | ICD-10-CM | POA: Diagnosis not present

## 2020-02-06 DIAGNOSIS — Z13220 Encounter for screening for lipoid disorders: Secondary | ICD-10-CM | POA: Diagnosis not present

## 2020-02-06 DIAGNOSIS — Z0001 Encounter for general adult medical examination with abnormal findings: Secondary | ICD-10-CM | POA: Diagnosis not present

## 2020-02-06 DIAGNOSIS — Z125 Encounter for screening for malignant neoplasm of prostate: Secondary | ICD-10-CM | POA: Diagnosis not present

## 2020-02-06 DIAGNOSIS — R270 Ataxia, unspecified: Secondary | ICD-10-CM

## 2020-02-06 DIAGNOSIS — R202 Paresthesia of skin: Secondary | ICD-10-CM

## 2020-02-06 DIAGNOSIS — K219 Gastro-esophageal reflux disease without esophagitis: Secondary | ICD-10-CM

## 2020-02-06 MED ORDER — BENAZEPRIL HCL 20 MG PO TABS: 20.0000 mg | ORAL_TABLET | Freq: Every day | ORAL | 3 refills | Status: DC

## 2020-02-06 MED ORDER — OMEPRAZOLE 20 MG PO CPDR: 20.0000 mg | DELAYED_RELEASE_CAPSULE | Freq: Every day | ORAL | 3 refills | Status: DC

## 2020-02-06 NOTE — Assessment & Plan Note (Signed)
At goal.  Continue benazepril 20 mg daily. 

## 2020-02-06 NOTE — Assessment & Plan Note (Signed)
Symptoms worsen.  Has not seen neurology in about 4 years.  Will check labs today.  He will need to follow-up with him soon given worsening symptoms.

## 2020-02-06 NOTE — Assessment & Plan Note (Signed)
Check B12.  Will refill omeprazole today.
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
## 2020-02-06 NOTE — Patient Instructions (Signed)
It was very nice to see you today!  We will check blood work today.  It is important that you follow-up with your neurologist.  I will place a referral today.  We will call you next week with the results of your blood work.  Take care, Dr Jerline Pain  Please try these tips to maintain a healthy lifestyle:   Eat at least 3 REAL meals and 1-2 snacks per day.  Aim for no more than 5 hours between eating.  If you eat breakfast, please do so within one hour of getting up.    Each meal should contain half fruits/vegetables, one quarter protein, and one quarter carbs (no bigger than a computer mouse)   Cut down on sweet beverages. This includes juice, soda, and sweet tea.     Drink at least 1 glass of water with each meal and aim for at least 8 glasses per day   Exercise at least 150 minutes every week.    Preventive Care 62-21 Years Old, Male Preventive care refers to lifestyle choices and visits with your health care provider that can promote health and wellness. This includes:  A yearly physical exam. This is also called an annual well check.  Regular dental and eye exams.  Immunizations.  Screening for certain conditions.  Healthy lifestyle choices, such as eating a healthy diet, getting regular exercise, not using drugs or products that contain nicotine and tobacco, and limiting alcohol use. What can I expect for my preventive care visit? Physical exam Your health care provider will check:  Height and weight. These may be used to calculate body mass index (BMI), which is a measurement that tells if you are at a healthy weight.  Heart rate and blood pressure.  Your skin for abnormal spots. Counseling Your health care provider may ask you questions about:  Alcohol, tobacco, and drug use.  Emotional well-being.  Home and relationship well-being.  Sexual activity.  Eating habits.  Work and work Statistician. What immunizations do I need?  Influenza (flu)  vaccine  This is recommended every year. Tetanus, diphtheria, and pertussis (Tdap) vaccine  You may need a Td booster every 10 years. Varicella (chickenpox) vaccine  You may need this vaccine if you have not already been vaccinated. Zoster (shingles) vaccine  You may need this after age 87. Measles, mumps, and rubella (MMR) vaccine  You may need at least one dose of MMR if you were born in 1957 or later. You may also need a second dose. Pneumococcal conjugate (PCV13) vaccine  You may need this if you have certain conditions and were not previously vaccinated. Pneumococcal polysaccharide (PPSV23) vaccine  You may need one or two doses if you smoke cigarettes or if you have certain conditions. Meningococcal conjugate (MenACWY) vaccine  You may need this if you have certain conditions. Hepatitis A vaccine  You may need this if you have certain conditions or if you travel or work in places where you may be exposed to hepatitis A. Hepatitis B vaccine  You may need this if you have certain conditions or if you travel or work in places where you may be exposed to hepatitis B. Haemophilus influenzae type b (Hib) vaccine  You may need this if you have certain risk factors. Human papillomavirus (HPV) vaccine  If recommended by your health care provider, you may need three doses over 6 months. You may receive vaccines as individual doses or as more than one vaccine together in one shot (combination vaccines). Talk  with your health care provider about the risks and benefits of combination vaccines. What tests do I need? Blood tests  Lipid and cholesterol levels. These may be checked every 5 years, or more frequently if you are over 62 years old.  Hepatitis C test.  Hepatitis B test. Screening  Lung cancer screening. You may have this screening every year starting at age 62 if you have a 30-pack-year history of smoking and currently smoke or have quit within the past 15  years.  Prostate cancer screening. Recommendations will vary depending on your family history and other risks.  Colorectal cancer screening. All adults should have this screening starting at age 62 and continuing until age 48. Your health care provider may recommend screening at age 62 if you are at increased risk. You will have tests every 1-10 years, depending on your results and the type of screening test.  Diabetes screening. This is done by checking your blood sugar (glucose) after you have not eaten for a while (fasting). You may have this done every 1-3 years.  Sexually transmitted disease (STD) testing. Follow these instructions at home: Eating and drinking  Eat a diet that includes fresh fruits and vegetables, whole grains, lean protein, and low-fat dairy products.  Take vitamin and mineral supplements as recommended by your health care provider.  Do not drink alcohol if your health care provider tells you not to drink.  If you drink alcohol: ? Limit how much you have to 0-2 drinks a day. ? Be aware of how much alcohol is in your drink. In the [place] one drink equals one 12 oz bottle of beer (355 mL), one 5 oz glass of wine (148 mL), or one 1 oz glass of hard liquor (44 mL). Lifestyle  Take daily care of your teeth and gums.  Stay active. Exercise for at least 30 minutes on 5 or more days each week.  Do not use any products that contain nicotine or tobacco, such as cigarettes, e-cigarettes, and chewing tobacco. If you need help quitting, ask your health care provider.  If you are sexually active, practice safe sex. Use a condom or other form of protection to prevent STIs (sexually transmitted infections).  Talk with your health care provider about taking a low-dose aspirin every day starting at age 62. What's next?  Go to your health care provider once a year for a well check visit.  Ask your health care provider how often you should have your eyes and teeth  checked.  Stay up to date on all vaccines. This information is not intended to replace advice given to you by your health care provider. Make sure you discuss any questions you have with your health care provider. Document Revised: 05/09/2018 Document Reviewed: 05/09/2018 Elsevier Patient Education  2020 [employer].

## 2020-02-06 NOTE — Assessment & Plan Note (Signed)
Worsened.  Discussed importance of neurology follow-up.  Will place referral today.

## 2020-02-07 LAB — LIPID PANEL
Cholesterol: 168 mg/dL (ref <200)
HDL: 31 mg/dL — ABNORMAL LOW (ref >=40)
LDL Cholesterol (Calc): 103 mg/dL (calc) — ABNORMAL HIGH
Non-HDL Cholesterol (Calc): 137 mg/dL (calc) — ABNORMAL HIGH (ref <130)
Total CHOL/HDL Ratio: 5.4 (calc) — ABNORMAL HIGH (ref <5.0)
Triglycerides: 222 mg/dL — ABNORMAL HIGH (ref <150)

## 2020-02-07 LAB — COMPREHENSIVE METABOLIC PANEL
AG Ratio: 1.8 (calc) (ref 1.0–2.5)
ALT: 37 U/L (ref 9–46)
AST: 36 U/L — ABNORMAL HIGH (ref 10–35)
Albumin: 4.6 g/dL (ref 3.6–5.1)
Alkaline phosphatase (APISO): 74 U/L (ref 35–144)
BUN: 15 mg/dL (ref 7–25)
CO2: 26 mmol/L (ref 20–32)
Calcium: 9.5 mg/dL (ref 8.6–10.3)
Chloride: 101 mmol/L (ref 98–110)
Creat: 1.17 mg/dL (ref 0.70–1.25)
Globulin: 2.5 g/dL (calc) (ref 1.9–3.7)
Glucose, Bld: 102 mg/dL — ABNORMAL HIGH (ref 65–99)
Potassium: 4.6 mmol/L (ref 3.5–5.3)
Sodium: 138 mmol/L (ref 135–146)
Total Bilirubin: 0.8 mg/dL (ref 0.2–1.2)
Total Protein: 7.1 g/dL (ref 6.1–8.1)

## 2020-02-07 LAB — CBC
HCT: 46.5 % (ref 38.5–50.0)
Hemoglobin: 16.1 g/dL (ref 13.2–17.1)
MCH: 31.4 pg (ref 27.0–33.0)
MCHC: 34.6 g/dL (ref 32.0–36.0)
MCV: 90.6 fL (ref 80.0–100.0)
MPV: 9.7 fL (ref 7.5–12.5)
Platelets: 222 10*3/uL (ref 140–400)
RBC: 5.13 10*6/uL (ref 4.20–5.80)
RDW: 13.2 % (ref 11.0–15.0)
WBC: 6.2 10*3/uL (ref 3.8–10.8)

## 2020-02-07 LAB — TSH: TSH: 1.98 mIU/L (ref 0.40–4.50)

## 2020-02-07 LAB — HEMOGLOBIN A1C
Hgb A1c MFr Bld: 6.7 % of total Hgb — ABNORMAL HIGH (ref <5.7)
Mean Plasma Glucose: 146 (calc)
eAG (mmol/L): 8.1 (calc)

## 2020-02-07 LAB — PSA: PSA: 1 ng/mL (ref <=4.0)

## 2020-02-07 LAB — VITAMIN B12: Vitamin B-12: 1954 pg/mL — ABNORMAL HIGH (ref 200–1100)

## 2020-02-09 DIAGNOSIS — Z1159 Encounter for screening for other viral diseases: Secondary | ICD-10-CM | POA: Diagnosis not present

## 2020-02-09 DIAGNOSIS — Z20828 Contact with and (suspected) exposure to other viral communicable diseases: Secondary | ICD-10-CM | POA: Diagnosis not present

## 2020-02-09 NOTE — Progress Notes (Signed)
Please inform patient of the following:  His blood sugar is in the diabetic range. Cholesterol levels are borderline but stable. But all of his other results are NORMAL. Do not think this would explain his symptoms but we need to address it. Please send in metformin 500mg  daily if patient is willing to start. Regardless, he should continue working on diet and exercise and we should recheck in 3-6 months.  Algis Greenhouse. Jerline Pain, MD 02/09/2020 3:02 PM

## 2020-02-16 DIAGNOSIS — Z20828 Contact with and (suspected) exposure to other viral communicable diseases: Secondary | ICD-10-CM | POA: Diagnosis not present

## 2020-02-16 DIAGNOSIS — Z1159 Encounter for screening for other viral diseases: Secondary | ICD-10-CM | POA: Diagnosis not present

## 2020-02-19 DIAGNOSIS — Z1159 Encounter for screening for other viral diseases: Secondary | ICD-10-CM | POA: Diagnosis not present

## 2020-02-19 DIAGNOSIS — Z20828 Contact with and (suspected) exposure to other viral communicable diseases: Secondary | ICD-10-CM | POA: Diagnosis not present

## 2020-02-23 DIAGNOSIS — Z1159 Encounter for screening for other viral diseases: Secondary | ICD-10-CM | POA: Diagnosis not present

## 2020-02-23 DIAGNOSIS — Z20828 Contact with and (suspected) exposure to other viral communicable diseases: Secondary | ICD-10-CM | POA: Diagnosis not present

## 2020-02-25 ENCOUNTER — Other Ambulatory Visit: Payer: Self-pay | Admitting: *Deleted

## 2020-02-25 MED ORDER — METFORMIN HCL 500 MG PO TABS: 500.0000 mg | ORAL_TABLET | Freq: Every day | ORAL | 0 refills | Status: DC

## 2020-02-26 DIAGNOSIS — Z20828 Contact with and (suspected) exposure to other viral communicable diseases: Secondary | ICD-10-CM | POA: Diagnosis not present

## 2020-02-26 DIAGNOSIS — Z1159 Encounter for screening for other viral diseases: Secondary | ICD-10-CM | POA: Diagnosis not present

## 2020-03-01 ENCOUNTER — Ambulatory Visit: Payer: BC Managed Care – PPO | Admitting: Neurology

## 2020-03-01 DIAGNOSIS — Z1159 Encounter for screening for other viral diseases: Secondary | ICD-10-CM | POA: Diagnosis not present

## 2020-03-01 DIAGNOSIS — Z20828 Contact with and (suspected) exposure to other viral communicable diseases: Secondary | ICD-10-CM | POA: Diagnosis not present
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
## 2020-03-04 DIAGNOSIS — Z20828 Contact with and (suspected) exposure to other viral communicable diseases: Secondary | ICD-10-CM | POA: Diagnosis not present

## 2020-03-04 DIAGNOSIS — Z1159 Encounter for screening for other viral diseases: Secondary | ICD-10-CM | POA: Diagnosis not present

## 2020-03-08 ENCOUNTER — Other Ambulatory Visit: Payer: Self-pay

## 2020-03-08 ENCOUNTER — Ambulatory Visit: Payer: BC Managed Care – PPO | Admitting: Neurology

## 2020-03-08 ENCOUNTER — Encounter: Payer: Self-pay | Admitting: Neurology

## 2020-03-08 VITALS — BP 119/84 | HR 79 | Ht 75.0 in | Wt 260.0 lb

## 2020-03-08 DIAGNOSIS — R270 Ataxia, unspecified: Secondary | ICD-10-CM | POA: Diagnosis not present

## 2020-03-08 DIAGNOSIS — G609 Hereditary and idiopathic neuropathy, unspecified: Secondary | ICD-10-CM

## 2020-03-08 NOTE — Patient Instructions (Signed)
Let us know if you change your mind on genetic testing and/or MRI cervical spine.  The physicians and staff at [facility] are committed to providing excellent care. You may receive a survey requesting feedback about your experience at our office. We strive to receive "very good" responses to the survey questions. If you feel that your experience would prevent you from giving the office a "very good " response, please contact our office to try to remedy the situation. We may be reached at [phone]. Thank you for taking the time out of your busy day to complete the survey.

## 2020-03-11 DIAGNOSIS — Z1159 Encounter for screening for other viral diseases: Secondary | ICD-10-CM | POA: Diagnosis not present

## 2020-03-11 DIAGNOSIS — Z20828 Contact with and (suspected) exposure to other viral communicable diseases: Secondary | ICD-10-CM | POA: Diagnosis not present

## 2020-03-15 ENCOUNTER — Other Ambulatory Visit: Payer: Self-pay

## 2020-03-15 ENCOUNTER — Ambulatory Visit (INDEPENDENT_AMBULATORY_CARE_PROVIDER_SITE_OTHER): Payer: BC Managed Care – PPO | Admitting: Dermatology

## 2020-03-15 DIAGNOSIS — L578 Other skin changes due to chronic exposure to nonionizing radiation: Secondary | ICD-10-CM

## 2020-03-15 DIAGNOSIS — L570 Actinic keratosis: Secondary | ICD-10-CM | POA: Diagnosis not present

## 2020-03-15 DIAGNOSIS — L814 Other melanin hyperpigmentation: Secondary | ICD-10-CM

## 2020-03-15 DIAGNOSIS — D1800 Hemangioma unspecified site: Secondary | ICD-10-CM

## 2020-03-15 DIAGNOSIS — L821 Other seborrheic keratosis: Secondary | ICD-10-CM | POA: Diagnosis not present

## 2020-03-15 DIAGNOSIS — L820 Inflamed seborrheic keratosis: Secondary | ICD-10-CM

## 2020-03-15 DIAGNOSIS — D229 Melanocytic nevi, unspecified: Secondary | ICD-10-CM | POA: Diagnosis not present

## 2020-03-15 NOTE — Progress Notes (Signed)
   Follow-Up Visit   Subjective  Phillip Perry is a 64 y.o. male who presents for the following: Actinic Keratosis (check for any new or persistent skin lesions)  The following portions of the chart were reviewed this encounter and updated as appropriate:  Tobacco  Allergies  Meds  Problems  Med Hx  Surg Hx  Fam Hx     Review of Systems:  No other skin or systemic complaints except as noted in HPI or Assessment and Plan.  Objective  Well appearing patient in no apparent distress; mood and affect are within normal limits.  A focused examination was performed including Face, scalp, trunk, extremities . Relevant physical exam findings are noted in the Assessment and Plan.  Objective  Face and ears x 5 (5): Erythematous thin papules/macules with gritty scale.   Objective  R neck x 3, back x 2 (5): Erythematous keratotic or waxy stuck-on papule or plaque.    Assessment & Plan  AK (actinic keratosis) (5) Face and ears x 5  Destruction of lesion - Face and ears x 5 Complexity: simple   Destruction method: cryotherapy   Informed consent: discussed and consent obtained   Timeout:  patient name, date of birth, surgical site, and procedure verified Lesion destroyed using liquid nitrogen: Yes   Region frozen until ice ball extended beyond lesion: Yes   Outcome: patient tolerated procedure well with no complications   Post-procedure details: wound care instructions given    Inflamed seborrheic keratosis (5) R neck x 3, back x 2  Destruction of lesion - R neck x 3, back x 2 Complexity: simple   Destruction method: cryotherapy   Informed consent: discussed and consent obtained   Timeout:  patient name, date of birth, surgical site, and procedure verified Lesion destroyed using liquid nitrogen: Yes   Region frozen until ice ball extended beyond lesion: Yes   Outcome: patient tolerated procedure well with no complications   Post-procedure details: wound care instructions given      Seborrheic Keratoses - Stuck-on, waxy, tan-brown papules and plaques  - Discussed benign etiology and prognosis. - Observe - Call for any changes  Actinic Damage - diffuse scaly erythematous macules with underlying dyspigmentation - Recommend daily broad spectrum sunscreen SPF 30+ to sun-exposed areas, reapply every 2 hours as needed.  - Call for new or changing lesions.  Lentigines - Scattered tan macules - Discussed due to sun exposure - Benign, observe - Call for any changes  Melanocytic Nevi - Tan-brown and/or pink-flesh-colored symmetric macules and papules - Benign appearing on exam today - Observation - Call clinic for new or changing moles - Recommend daily use of broad spectrum spf 30+ sunscreen to sun-exposed areas.   Hemangiomas - Red papules - Discussed benign nature - Observe - Call for any changes  Return in about 6 months (around 09/13/2020) for TBSE.  Luther Redo, CMA, am acting as scribe for Sarina Ser, MD .  Documentation: I have reviewed the above documentation for accuracy and completeness, and I agree with the above.  Sarina Ser, MD

## 2020-03-16 ENCOUNTER — Encounter: Payer: Self-pay | Admitting: Dermatology

## 2020-03-18 DIAGNOSIS — Z20828 Contact with and (suspected) exposure to other viral communicable diseases: Secondary | ICD-10-CM | POA: Diagnosis not present

## 2020-03-18 DIAGNOSIS — Z1159 Encounter for screening for other viral diseases: Secondary | ICD-10-CM | POA: Diagnosis not present

## 2020-03-22 DIAGNOSIS — Z1159 Encounter for screening for other viral diseases: Secondary | ICD-10-CM | POA: Diagnosis not present

## 2020-03-22 DIAGNOSIS — Z20828 Contact with and (suspected) exposure to other viral communicable diseases: Secondary | ICD-10-CM | POA: Diagnosis not present

## 2020-03-25 DIAGNOSIS — Z20828 Contact with and (suspected) exposure to other viral communicable diseases: Secondary | ICD-10-CM | POA: Diagnosis not present

## 2020-03-25 DIAGNOSIS — Z1159 Encounter for screening for other viral diseases: Secondary | ICD-10-CM | POA: Diagnosis not present

## 2020-04-01 DIAGNOSIS — Z20828 Contact with and (suspected) exposure to other viral communicable diseases: Secondary | ICD-10-CM | POA: Diagnosis not present

## 2020-04-01 DIAGNOSIS — Z1159 Encounter for screening for other viral diseases: Secondary | ICD-10-CM | POA: Diagnosis not present

## 2020-04-05 DIAGNOSIS — Z20828 Contact with and (suspected) exposure to other viral communicable diseases: Secondary | ICD-10-CM | POA: Diagnosis not present

## 2020-04-05 DIAGNOSIS — Z1159 Encounter for screening for other viral diseases: Secondary | ICD-10-CM | POA: Diagnosis not present

## 2020-05-03 ENCOUNTER — Other Ambulatory Visit: Payer: Self-pay

## 2020-05-03 ENCOUNTER — Ambulatory Visit: Payer: BC Managed Care – PPO | Admitting: Family Medicine

## 2020-05-03 ENCOUNTER — Encounter: Payer: Self-pay | Admitting: Family Medicine

## 2020-05-03 VITALS — BP 111/77 | HR 90 | Temp 97.8°F | Ht 75.0 in | Wt 252.8 lb

## 2020-05-03 DIAGNOSIS — I10 Essential (primary) hypertension: Secondary | ICD-10-CM | POA: Diagnosis not present

## 2020-05-03 DIAGNOSIS — R739 Hyperglycemia, unspecified: Secondary | ICD-10-CM | POA: Insufficient documentation

## 2020-05-03 LAB — POCT GLYCOSYLATED HEMOGLOBIN (HGB A1C): Hemoglobin A1C: 5.8 % — AB (ref 4.0–5.6)

## 2020-05-03 MED ORDER — METFORMIN HCL 500 MG PO TABS
500.0000 mg | ORAL_TABLET | Freq: Every day | ORAL | 3 refills | Status: DC
Start: 2020-05-03 — End: 2021-05-16

## 2020-05-03 NOTE — Assessment & Plan Note (Signed)
A1c 5.8.  Patient down about 10 pounds since last time.  We will continue Metformin 500 mg daily.  He is tolerating well without side effects.  Recheck 9 months at CPE.  May be able to come off continues to be well controlled.  Discussed lifestyle modifications.

## 2020-05-03 NOTE — Patient Instructions (Signed)
It was very nice to see you today!  Your blood sugar looks much better.  We will continue the metformin and we can recheck at your next physical.  Please continue working on your diet and exercise.   Take care, Dr Jerline Pain  Please try these tips to maintain a healthy lifestyle:   Eat at least 3 REAL meals and 1-2 snacks per day.  Aim for no more than 5 hours between eating.  If you eat breakfast, please do so within one hour of getting up.    Each meal should contain half fruits/vegetables, one quarter protein, and one quarter carbs (no bigger than a computer mouse)   Cut down on sweet beverages. This includes juice, soda, and sweet tea.     Drink at least 1 glass of water with each meal and aim for at least 8 glasses per day   Exercise at least 150 minutes every week.

## 2020-05-03 NOTE — Progress Notes (Signed)
   Phillip Perry is a 64 y.o. male who presents today for an office visit.  Assessment/Plan:  Chronic Problems Addressed Today: Essential hypertension At goal.  Continue benazepril 20 mg daily.  Hyperglycemia A1c 5.8.  Patient down about 10 pounds since last time.  We will continue Metformin 500 mg daily.  He is tolerating well without side effects.  Recheck 9 months at CPE.  May be able to come off continues to be well controlled.  Discussed lifestyle modifications.     Subjective:  HPI:  See A/p.         Objective:  Physical Exam: BP 111/77   Pulse 90   Temp 97.8 F (36.6 C) (Temporal)   Ht 6\' 3"  (1.905 m)   Wt 252 lb 12.8 oz (114.7 kg)   SpO2 96%   BMI 31.60 kg/m   Wt Readings from Last 3 Encounters:  05/03/20 252 lb 12.8 oz (114.7 kg)  03/08/20 260 lb (117.9 kg)  02/06/20 261 lb (118.4 kg)  Gen: No acute distress, resting comfortably Neuro: Grossly normal, moves all extremities Psych: Normal affect and thought content      Jannis Atkins M. Jerline Pain, MD 05/03/2020 10:17 AM

## 2020-05-03 NOTE — Assessment & Plan Note (Signed)
At goal.  Continue benazepril 20 mg daily.

## 2020-05-24 DIAGNOSIS — Z1159 Encounter for screening for other viral diseases: Secondary | ICD-10-CM | POA: Diagnosis not present

## 2020-05-24 DIAGNOSIS — Z20828 Contact with and (suspected) exposure to other viral communicable diseases: Secondary | ICD-10-CM | POA: Diagnosis not present

## 2020-05-27 DIAGNOSIS — Z1159 Encounter for screening for other viral diseases: Secondary | ICD-10-CM | POA: Diagnosis not present

## 2020-05-27 DIAGNOSIS — Z20828 Contact with and (suspected) exposure to other viral communicable diseases: Secondary | ICD-10-CM | POA: Diagnosis not present

## 2020-05-31 DIAGNOSIS — Z1159 Encounter for screening for other viral diseases: Secondary | ICD-10-CM | POA: Diagnosis not present

## 2020-05-31 DIAGNOSIS — Z20828 Contact with and (suspected) exposure to other viral communicable diseases: Secondary | ICD-10-CM | POA: Diagnosis not present

## 2020-06-03 DIAGNOSIS — Z1159 Encounter for screening for other viral diseases: Secondary | ICD-10-CM | POA: Diagnosis not present

## 2020-06-03 DIAGNOSIS — Z20828 Contact with and (suspected) exposure to other viral communicable diseases: Secondary | ICD-10-CM | POA: Diagnosis not present

## 2020-06-10 DIAGNOSIS — Z20828 Contact with and (suspected) exposure to other viral communicable diseases: Secondary | ICD-10-CM | POA: Diagnosis not present

## 2020-06-10 DIAGNOSIS — Z1159 Encounter for screening for other viral diseases: Secondary | ICD-10-CM | POA: Diagnosis not present

## 2020-06-17 DIAGNOSIS — Z1159 Encounter for screening for other viral diseases: Secondary | ICD-10-CM | POA: Diagnosis not present

## 2020-06-17 DIAGNOSIS — Z20828 Contact with and (suspected) exposure to other viral communicable diseases: Secondary | ICD-10-CM | POA: Diagnosis not present

## 2020-06-24 DIAGNOSIS — Z20828 Contact with and (suspected) exposure to other viral communicable diseases: Secondary | ICD-10-CM | POA: Diagnosis not present

## 2020-06-24 DIAGNOSIS — Z1159 Encounter for screening for other viral diseases: Secondary | ICD-10-CM | POA: Diagnosis not present

## 2020-07-15 DIAGNOSIS — Z1159 Encounter for screening for other viral diseases: Secondary | ICD-10-CM | POA: Diagnosis not present

## 2020-07-15 DIAGNOSIS — Z20828 Contact with and (suspected) exposure to other viral communicable diseases: Secondary | ICD-10-CM | POA: Diagnosis not present

## 2020-08-09 DIAGNOSIS — Z20828 Contact with and (suspected) exposure to other viral communicable diseases: Secondary | ICD-10-CM | POA: Diagnosis not present

## 2020-08-09 DIAGNOSIS — Z1159 Encounter for screening for other viral diseases: Secondary | ICD-10-CM | POA: Diagnosis not present

## 2020-08-12 DIAGNOSIS — Z20828 Contact with and (suspected) exposure to other viral communicable diseases: Secondary | ICD-10-CM | POA: Diagnosis not present

## 2020-08-12 DIAGNOSIS — Z1159 Encounter for screening for other viral diseases: Secondary | ICD-10-CM | POA: Diagnosis not present

## 2020-08-23 DIAGNOSIS — Z20828 Contact with and (suspected) exposure to other viral communicable diseases: Secondary | ICD-10-CM | POA: Diagnosis not present

## 2020-08-23 DIAGNOSIS — Z1159 Encounter for screening for other viral diseases: Secondary | ICD-10-CM | POA: Diagnosis not present

## 2020-08-26 DIAGNOSIS — Z20828 Contact with and (suspected) exposure to other viral communicable diseases: Secondary | ICD-10-CM | POA: Diagnosis not present

## 2020-08-26 DIAGNOSIS — Z1159 Encounter for screening for other viral diseases: Secondary | ICD-10-CM | POA: Diagnosis not present

## 2020-08-30 DIAGNOSIS — Z1159 Encounter for screening for other viral diseases: Secondary | ICD-10-CM | POA: Diagnosis not present

## 2020-08-30 DIAGNOSIS — Z20828 Contact with and (suspected) exposure to other viral communicable diseases: Secondary | ICD-10-CM | POA: Diagnosis not present

## 2020-09-02 DIAGNOSIS — Z1159 Encounter for screening for other viral diseases: Secondary | ICD-10-CM | POA: Diagnosis not present

## 2020-09-02 DIAGNOSIS — Z20828 Contact with and (suspected) exposure to other viral communicable diseases: Secondary | ICD-10-CM | POA: Diagnosis not present

## 2020-09-06 DIAGNOSIS — Z1159 Encounter for screening for other viral diseases: Secondary | ICD-10-CM | POA: Diagnosis not present

## 2020-09-06 DIAGNOSIS — Z20828 Contact with and (suspected) exposure to other viral communicable diseases: Secondary | ICD-10-CM | POA: Diagnosis not present

## 2020-09-09 DIAGNOSIS — Z20828 Contact with and (suspected) exposure to other viral communicable diseases: Secondary | ICD-10-CM | POA: Diagnosis not present

## 2020-09-09 DIAGNOSIS — Z1159 Encounter for screening for other viral diseases: Secondary | ICD-10-CM | POA: Diagnosis not present

## 2020-09-13 ENCOUNTER — Ambulatory Visit: Payer: BC Managed Care – PPO | Admitting: Dermatology

## 2020-09-16 DIAGNOSIS — Z1159 Encounter for screening for other viral diseases: Secondary | ICD-10-CM | POA: Diagnosis not present

## 2020-09-16 DIAGNOSIS — Z20828 Contact with and (suspected) exposure to other viral communicable diseases: Secondary | ICD-10-CM | POA: Diagnosis not present

## 2020-09-20 DIAGNOSIS — Z20828 Contact with and (suspected) exposure to other viral communicable diseases: Secondary | ICD-10-CM | POA: Diagnosis not present

## 2020-09-20 DIAGNOSIS — Z1159 Encounter for screening for other viral diseases: Secondary | ICD-10-CM | POA: Diagnosis not present

## 2020-09-23 DIAGNOSIS — Z1159 Encounter for screening for other viral diseases: Secondary | ICD-10-CM | POA: Diagnosis not present

## 2020-09-23 DIAGNOSIS — Z20828 Contact with and (suspected) exposure to other viral communicable diseases: Secondary | ICD-10-CM | POA: Diagnosis not present

## 2020-09-27 DIAGNOSIS — Z1159 Encounter for screening for other viral diseases: Secondary | ICD-10-CM | POA: Diagnosis not present

## 2020-09-27 DIAGNOSIS — Z20828 Contact with and (suspected) exposure to other viral communicable diseases: Secondary | ICD-10-CM | POA: Diagnosis not present

## 2020-09-30 DIAGNOSIS — Z1159 Encounter for screening for other viral diseases: Secondary | ICD-10-CM | POA: Diagnosis not present

## 2020-09-30 DIAGNOSIS — Z20828 Contact with and (suspected) exposure to other viral communicable diseases: Secondary | ICD-10-CM | POA: Diagnosis not present

## 2020-10-04 DIAGNOSIS — Z20828 Contact with and (suspected) exposure to other viral communicable diseases: Secondary | ICD-10-CM | POA: Diagnosis not present

## 2020-10-04 DIAGNOSIS — Z1159 Encounter for screening for other viral diseases: Secondary | ICD-10-CM | POA: Diagnosis not present

## 2020-10-07 DIAGNOSIS — Z1159 Encounter for screening for other viral diseases: Secondary | ICD-10-CM | POA: Diagnosis not present

## 2020-10-07 DIAGNOSIS — Z20828 Contact with and (suspected) exposure to other viral communicable diseases: Secondary | ICD-10-CM | POA: Diagnosis not present

## 2020-10-11 DIAGNOSIS — Z20828 Contact with and (suspected) exposure to other viral communicable diseases: Secondary | ICD-10-CM | POA: Diagnosis not present

## 2020-10-11 DIAGNOSIS — Z1159 Encounter for screening for other viral diseases: Secondary | ICD-10-CM | POA: Diagnosis not present

## 2020-10-14 DIAGNOSIS — Z20828 Contact with and (suspected) exposure to other viral communicable diseases: Secondary | ICD-10-CM | POA: Diagnosis not present

## 2020-10-14 DIAGNOSIS — Z1159 Encounter for screening for other viral diseases: Secondary | ICD-10-CM | POA: Diagnosis not present

## 2020-10-18 DIAGNOSIS — Z20828 Contact with and (suspected) exposure to other viral communicable diseases: Secondary | ICD-10-CM | POA: Diagnosis not present

## 2020-10-18 DIAGNOSIS — Z1159 Encounter for screening for other viral diseases: Secondary | ICD-10-CM | POA: Diagnosis not present

## 2020-10-21 ENCOUNTER — Ambulatory Visit: Payer: BC Managed Care – PPO | Admitting: Dermatology

## 2020-10-21 ENCOUNTER — Other Ambulatory Visit: Payer: Self-pay

## 2020-10-21 ENCOUNTER — Encounter: Payer: Self-pay | Admitting: Dermatology

## 2020-10-21 DIAGNOSIS — C44519 Basal cell carcinoma of skin of other part of trunk: Secondary | ICD-10-CM

## 2020-10-21 DIAGNOSIS — L820 Inflamed seborrheic keratosis: Secondary | ICD-10-CM | POA: Diagnosis not present

## 2020-10-21 DIAGNOSIS — I781 Nevus, non-neoplastic: Secondary | ICD-10-CM

## 2020-10-21 DIAGNOSIS — Z1159 Encounter for screening for other viral diseases: Secondary | ICD-10-CM | POA: Diagnosis not present

## 2020-10-21 DIAGNOSIS — Z85828 Personal history of other malignant neoplasm of skin: Secondary | ICD-10-CM

## 2020-10-21 DIAGNOSIS — D485 Neoplasm of uncertain behavior of skin: Secondary | ICD-10-CM

## 2020-10-21 DIAGNOSIS — D229 Melanocytic nevi, unspecified: Secondary | ICD-10-CM

## 2020-10-21 DIAGNOSIS — Z1283 Encounter for screening for malignant neoplasm of skin: Secondary | ICD-10-CM | POA: Diagnosis not present

## 2020-10-21 DIAGNOSIS — D1800 Hemangioma unspecified site: Secondary | ICD-10-CM

## 2020-10-21 DIAGNOSIS — L814 Other melanin hyperpigmentation: Secondary | ICD-10-CM

## 2020-10-21 DIAGNOSIS — L578 Other skin changes due to chronic exposure to nonionizing radiation: Secondary | ICD-10-CM | POA: Diagnosis not present

## 2020-10-21 DIAGNOSIS — L570 Actinic keratosis: Secondary | ICD-10-CM | POA: Diagnosis not present

## 2020-10-21 DIAGNOSIS — L821 Other seborrheic keratosis: Secondary | ICD-10-CM

## 2020-10-21 DIAGNOSIS — Z20828 Contact with and (suspected) exposure to other viral communicable diseases: Secondary | ICD-10-CM | POA: Diagnosis not present

## 2020-10-21 NOTE — Progress Notes (Signed)
Follow-Up Visit   Subjective  Phillip Perry is a 65 y.o. male who presents for the following: Annual Exam (History of BCC - TBSE today). The patient presents for Total-Body Skin Exam (TBSE) for skin cancer screening and mole check.  The following portions of the chart were reviewed this encounter and updated as appropriate:   Tobacco  Allergies  Meds  Problems  Med Hx  Surg Hx  Fam Hx     Review of Systems:  No other skin or systemic complaints except as noted in HPI or Assessment and Plan.  Objective  Well appearing patient in no apparent distress; mood and affect are within normal limits.  A full examination was performed including scalp, head, eyes, ears, nose, lips, neck, chest, axillae, abdomen, back, buttocks, bilateral upper extremities, bilateral lower extremities, hands, feet, fingers, toes, fingernails, and toenails. All findings within normal limits unless otherwise noted below.  Objective  Right chest x 1, right neck x 1, left leg x 1, neck x 16 (19): Erythematous keratotic or waxy stuck-on papule or plaque.   Objective  Left sup sternal notch: 1.5 x 1.0 cm crusted patch  Objective  Face (4): Erythematous thin papules/macules with gritty scale.   Objective  Left lat bicep: 0.8 x 0.7 cm vascular patch   Assessment & Plan    Lentigines - Scattered tan macules - Due to sun exposure - Benign-appering, observe - Recommend daily broad spectrum sunscreen SPF 30+ to sun-exposed areas, reapply every 2 hours as needed. - Call for any changes  Seborrheic Keratoses - Stuck-on, waxy, tan-brown papules and/or plaques  - Benign-appearing - Discussed benign etiology and prognosis. - Observe - Call for any changes  Melanocytic Nevi - Tan-brown and/or pink-flesh-colored symmetric macules and papules - Benign appearing on exam today - Observation - Call clinic for new or changing moles - Recommend daily use of broad spectrum spf 30+ sunscreen to sun-exposed  areas.   Hemangiomas - Red papules - Discussed benign nature - Observe - Call for any changes  Actinic Damage - Chronic condition, secondary to cumulative UV/sun exposure - diffuse scaly erythematous macules with underlying dyspigmentation - Recommend daily broad spectrum sunscreen SPF 30+ to sun-exposed areas, reapply every 2 hours as needed.  - Staying in the shade or wearing long sleeves, sun glasses (UVA+UVB protection) and wide brim hats (4-inch brim around the entire circumference of the hat) are also recommended for sun protection.  - Call for new or changing lesions.  Skin cancer screening performed today.  Inflamed seborrheic keratosis (19) Right chest x 1, right neck x 1, left leg x 1, neck x 16  Destruction of lesion - Right chest x 1, right neck x 1, left leg x 1, neck x 16 Complexity: simple   Destruction method: cryotherapy   Informed consent: discussed and consent obtained   Timeout:  patient name, date of birth, surgical site, and procedure verified Lesion destroyed using liquid nitrogen: Yes   Region frozen until ice ball extended beyond lesion: Yes   Outcome: patient tolerated procedure well with no complications   Post-procedure details: wound care instructions given    Neoplasm of uncertain behavior of skin Left sup sternal notch  Skin / nail biopsy Type of biopsy: tangential   Informed consent: discussed and consent obtained   Timeout: patient name, date of birth, surgical site, and procedure verified   Procedure prep:  Patient was prepped and draped in usual sterile fashion Prep type:  Isopropyl alcohol Anesthesia: the  lesion was anesthetized in a standard fashion   Anesthetic:  1% lidocaine w/ epinephrine 1-100,000 buffered w/ 8.4% NaHCO3 Instrument used: flexible razor blade   Hemostasis achieved with: pressure, aluminum chloride and electrodesiccation   Outcome: patient tolerated procedure well   Post-procedure details: sterile dressing applied and  wound care instructions given   Dressing type: bandage and petrolatum    Specimen 1 - Surgical pathology Differential Diagnosis: BCC vs other Check Margins: No 1.5 x 1.0 cm crusted patch  AK (actinic keratosis) (4) Face  Destruction of lesion - Face Complexity: simple   Destruction method: cryotherapy   Informed consent: discussed and consent obtained   Timeout:  patient name, date of birth, surgical site, and procedure verified Lesion destroyed using liquid nitrogen: Yes   Region frozen until ice ball extended beyond lesion: Yes   Outcome: patient tolerated procedure well with no complications   Post-procedure details: wound care instructions given    Telangiectasia Left lat bicep  Telangiectasia with possible associated dermatofibroma - Benign appearing, observe.  Skin cancer screening  Return in about 6 months (around 04/23/2021).   I, Ashok Cordia, CMA, am acting as scribe for Sarina Ser, MD .  Documentation: I have reviewed the above documentation for accuracy and completeness, and I agree with the above.  Sarina Ser, MD

## 2020-10-21 NOTE — Patient Instructions (Signed)
Wound Care Instructions  1. Cleanse wound gently with soap and water once a day then pat dry with clean gauze. Apply a thing coat of Petrolatum (petroleum jelly, "Vaseline") over the wound (unless you have an allergy to this). We recommend that you use a new, sterile tube of Vaseline. Do not pick or remove scabs. Do not remove the yellow or white "healing tissue" from the base of the wound.  2. Cover the wound with fresh, clean, nonstick gauze and secure with paper tape. You may use Band-Aids in place of gauze and tape if the would is small enough, but would recommend trimming much of the tape off as there is often too much. Sometimes Band-Aids can irritate the skin.  3. You should call the office for your biopsy report after 1 week if you have not already been contacted.  4. If you experience any problems, such as abnormal amounts of bleeding, swelling, significant bruising, significant pain, or evidence of infection, please call the office immediately.  5. FOR ADULT SURGERY PATIENTS: If you need something for pain relief you may take 1 extra strength Tylenol (acetaminophen) AND 2 Ibuprofen (200mg  each) together every 4 hours as needed for pain. (do not take these if you are allergic to them or if you have a reason you should not take them.) Typically, you may only need pain medication for 1 to 3 days.    Cryotherapy Aftercare  . Wash gently with soap and water everyday.   Marland Kitchen Apply Vaseline and Band-Aid daily until healed.   If you have any questions or concerns for your doctor, please call our main line at [phone] and press option 4 to reach your doctor's medical assistant. If no one answers, please leave a voicemail as directed and we will return your call as soon as possible. Messages left after 4 pm will be answered the following business day.   You may also send [name] a message via [facility]. We typically respond to MyChart messages within 1-2 business days.  For prescription refills,  please ask your pharmacy to contact our office. Our fax number is [phone].  If you have an urgent issue when the clinic is closed that cannot wait until the next business day, you can page your doctor at the number below.    Please note that while we do our best to be available for urgent issues outside of office hours, we are not available 24/7.   If you have an urgent issue and are unable to reach [name], you may choose to seek medical care at your doctor's office, retail clinic, urgent care center, or emergency room.  If you have a medical emergency, please immediately call 911 or go to the emergency department.  Pager Numbers  - Dr. Nehemiah Massed: [phone]  - Dr. Laurence Ferrari: [phone]  - Dr. Nicole Kindred: [phone]  In the event of inclement weather, please call our main line at [phone] for an update on the status of any delays or closures.  Dermatology Medication Tips: Please keep the boxes that topical medications come in in order to help keep track of the instructions about where and how to use these. Pharmacies typically print the medication instructions only on the boxes and not directly on the medication tubes.   If your medication is too expensive, please contact our office at [phone] option 4 or send [name] a message through [facility].   We are unable to tell what your co-pay for medications will be in advance as this is different  depending on your insurance coverage. However, we may be able to find a substitute medication at lower cost or fill out paperwork to get insurance to cover a needed medication.   If a prior authorization is required to get your medication covered by your insurance company, please allow [name] 1-2 business days to complete this process.  Drug prices often vary depending on where the prescription is filled and some pharmacies may offer cheaper prices.  The website www.goodrx.com contains coupons for medications through different pharmacies. The prices  here do not account for what the cost may be with help from insurance (it may be cheaper with your insurance), but the website can give you the price if you did not use any insurance.  - You can print the associated coupon and take it with your prescription to the pharmacy.  - You may also stop by our office during regular business hours and pick up a GoodRx coupon card.  - If you need your prescription sent electronically to a different pharmacy, notify our office through [facility] or by phone at [phone] option 4.

## 2020-10-25 ENCOUNTER — Encounter: Payer: Self-pay | Admitting: Dermatology

## 2020-10-28 DIAGNOSIS — Z1159 Encounter for screening for other viral diseases: Secondary | ICD-10-CM | POA: Diagnosis not present

## 2020-10-28 DIAGNOSIS — Z20828 Contact with and (suspected) exposure to other viral communicable diseases: Secondary | ICD-10-CM | POA: Diagnosis not present

## 2020-11-01 ENCOUNTER — Telehealth: Payer: Self-pay

## 2020-11-01 DIAGNOSIS — Z1159 Encounter for screening for other viral diseases: Secondary | ICD-10-CM | POA: Diagnosis not present

## 2020-11-01 DIAGNOSIS — Z20828 Contact with and (suspected) exposure to other viral communicable diseases: Secondary | ICD-10-CM | POA: Diagnosis not present

## 2020-11-01 NOTE — Telephone Encounter (Signed)
Patient informed of pathology results and surgery scheduled. 

## 2020-11-01 NOTE — Telephone Encounter (Signed)
-----   Message from Ralene Bathe, MD sent at 10/29/2020 12:57 PM EDT ----- Diagnosis Skin , left sup sternal notch BASAL CELL CARCINOMA, NODULAR PATTERN, ULCERATED  Cancer - BCC As suspected Schedule surgery

## 2020-11-04 DIAGNOSIS — Z1159 Encounter for screening for other viral diseases: Secondary | ICD-10-CM | POA: Diagnosis not present

## 2020-11-04 DIAGNOSIS — Z20828 Contact with and (suspected) exposure to other viral communicable diseases: Secondary | ICD-10-CM | POA: Diagnosis not present

## 2020-11-08 DIAGNOSIS — Z20828 Contact with and (suspected) exposure to other viral communicable diseases: Secondary | ICD-10-CM | POA: Diagnosis not present

## 2020-11-08 DIAGNOSIS — Z1159 Encounter for screening for other viral diseases: Secondary | ICD-10-CM | POA: Diagnosis not present

## 2020-11-11 DIAGNOSIS — Z20828 Contact with and (suspected) exposure to other viral communicable diseases: Secondary | ICD-10-CM | POA: Diagnosis not present

## 2020-11-11 DIAGNOSIS — Z1159 Encounter for screening for other viral diseases: Secondary | ICD-10-CM | POA: Diagnosis not present

## 2020-11-22 DIAGNOSIS — Z20828 Contact with and (suspected) exposure to other viral communicable diseases: Secondary | ICD-10-CM | POA: Diagnosis not present

## 2020-11-22 DIAGNOSIS — Z1159 Encounter for screening for other viral diseases: Secondary | ICD-10-CM | POA: Diagnosis not present

## 2020-11-25 DIAGNOSIS — Z20828 Contact with and (suspected) exposure to other viral communicable diseases: Secondary | ICD-10-CM | POA: Diagnosis not present

## 2020-11-25 DIAGNOSIS — Z1159 Encounter for screening for other viral diseases: Secondary | ICD-10-CM | POA: Diagnosis not present

## 2020-12-02 DIAGNOSIS — Z20828 Contact with and (suspected) exposure to other viral communicable diseases: Secondary | ICD-10-CM | POA: Diagnosis not present

## 2020-12-02 DIAGNOSIS — Z1159 Encounter for screening for other viral diseases: Secondary | ICD-10-CM | POA: Diagnosis not present

## 2020-12-09 DIAGNOSIS — Z20828 Contact with and (suspected) exposure to other viral communicable diseases: Secondary | ICD-10-CM | POA: Diagnosis not present

## 2020-12-09 DIAGNOSIS — Z1159 Encounter for screening for other viral diseases: Secondary | ICD-10-CM | POA: Diagnosis not present

## 2020-12-13 DIAGNOSIS — Z20828 Contact with and (suspected) exposure to other viral communicable diseases: Secondary | ICD-10-CM | POA: Diagnosis not present

## 2020-12-13 DIAGNOSIS — Z1159 Encounter for screening for other viral diseases: Secondary | ICD-10-CM | POA: Diagnosis not present

## 2020-12-16 DIAGNOSIS — Z20828 Contact with and (suspected) exposure to other viral communicable diseases: Secondary | ICD-10-CM | POA: Diagnosis not present

## 2020-12-16 DIAGNOSIS — Z1159 Encounter for screening for other viral diseases: Secondary | ICD-10-CM | POA: Diagnosis not present

## 2020-12-20 DIAGNOSIS — Z1159 Encounter for screening for other viral diseases: Secondary | ICD-10-CM | POA: Diagnosis not present

## 2020-12-20 DIAGNOSIS — Z20828 Contact with and (suspected) exposure to other viral communicable diseases: Secondary | ICD-10-CM | POA: Diagnosis not present

## 2020-12-21 ENCOUNTER — Ambulatory Visit: Payer: BC Managed Care – PPO | Admitting: Dermatology

## 2020-12-21 ENCOUNTER — Other Ambulatory Visit: Payer: Self-pay

## 2020-12-21 DIAGNOSIS — C4491 Basal cell carcinoma of skin, unspecified: Secondary | ICD-10-CM

## 2020-12-21 DIAGNOSIS — C4441 Basal cell carcinoma of skin of scalp and neck: Secondary | ICD-10-CM

## 2020-12-21 DIAGNOSIS — C44519 Basal cell carcinoma of skin of other part of trunk: Secondary | ICD-10-CM | POA: Diagnosis not present

## 2020-12-21 HISTORY — DX: Basal cell carcinoma of skin, unspecified: C44.91

## 2020-12-21 MED ORDER — MUPIROCIN 2 % EX OINT: 1.0000 "application " | TOPICAL_OINTMENT | Freq: Every day | CUTANEOUS | 0 refills | Status: DC

## 2020-12-21 NOTE — Patient Instructions (Signed)
Wound Care Instructions  Cleanse wound gently with soap and water once a day then pat dry with clean gauze. Apply a thing coat of Petrolatum (petroleum jelly, "Vaseline") over the wound (unless you have an allergy to this). We recommend that you use a new, sterile tube of Vaseline. Do not pick or remove scabs. Do not remove the yellow or white "healing tissue" from the base of the wound.  Cover the wound with fresh, clean, nonstick gauze and secure with paper tape. You may use Band-Aids in place of gauze and tape if the would is small enough, but would recommend trimming much of the tape off as there is often too much. Sometimes Band-Aids can irritate the skin.  You should call the office for your biopsy report after 1 week if you have not already been contacted.  If you experience any problems, such as abnormal amounts of bleeding, swelling, significant bruising, significant pain, or evidence of infection, please call the office immediately.  FOR ADULT SURGERY PATIENTS: If you need something for pain relief you may take 1 extra strength Tylenol (acetaminophen) AND 2 Ibuprofen (200mg each) together every 4 hours as needed for pain. (do not take these if you are allergic to them or if you have a reason you should not take them.) Typically, you may only need pain medication for 1 to 3 days.   If you have any questions or concerns for your doctor, please call our main line at [phone] and press option 4 to reach your doctor's medical assistant. If no one answers, please leave a voicemail as directed and we will return your call as soon as possible. Messages left after 4 pm will be answered the following business day.   You may also send us a message via MyChart. We typically respond to MyChart messages within 1-2 business days.  For prescription refills, please ask your pharmacy to contact our office. Our fax number is [phone].  If you have an urgent issue when the clinic is closed that  cannot wait until the next business day, you can page your doctor at the number below.    Please note that while we do our best to be available for urgent issues outside of office hours, we are not available 24/7.   If you have an urgent issue and are unable to reach us, you may choose to seek medical care at your doctor's office, retail clinic, urgent care center, or emergency room.  If you have a medical emergency, please immediately call 911 or go to the emergency department.  Pager Numbers  - Dr. Kowalski: [phone]  - Dr. Moye: [phone]  - Dr. Stewart: [phone]  In the event of inclement weather, please call our main line at [phone] for an update on the status of any delays or closures.  Dermatology Medication Tips: Please keep the boxes that topical medications come in in order to help keep track of the instructions about where and how to use these. Pharmacies typically print the medication instructions only on the boxes and not directly on the medication tubes.   If your medication is too expensive, please contact our office at [phone] option 4 or send us a message through MyChart.   We are unable to tell what your co-pay for medications will be in advance as this is different depending on your insurance coverage. However, we may be able to find a substitute medication at lower cost or fill out paperwork to get insurance to cover a needed   medication.   If a prior authorization is required to get your medication covered by your insurance company, please allow us 1-2 business days to complete this process.  Drug prices often vary depending on where the prescription is filled and some pharmacies may offer cheaper prices.  The website www.goodrx.com contains coupons for medications through different pharmacies. The prices here do not account for what the cost may be with help from insurance (it may be cheaper with your insurance), but the website can give you the  price if you did not use any insurance.  - You can print the associated coupon and take it with your prescription to the pharmacy.  - You may also stop by our office during regular business hours and pick up a GoodRx coupon card.  - If you need your prescription sent electronically to a different pharmacy, notify our office through [place] MyChart or by phone at [phone] option 4.   

## 2020-12-21 NOTE — Progress Notes (Signed)
   Follow-Up Visit   Subjective  Phillip Perry is a 65 y.o. male who presents for the following: Basal Cell Carcinoma (Biopsy proven BCC of left sup sternal notch - [facility] today/).  The following portions of the chart were reviewed this encounter and updated as appropriate:   Tobacco  Allergies  Meds  Problems  Med Hx  Surg Hx  Fam Hx     Review of Systems:  No other skin or systemic complaints except as noted in HPI or Assessment and Plan.  Objective  Well appearing patient in no apparent distress; mood and affect are within normal limits.  A focused examination was performed including chest. Relevant physical exam findings are noted in the Assessment and Plan.  Chest - Medial [facility] [place]) Well healed biopsy site   Assessment & Plan  Basal cell carcinoma (BCC) of chest and neck Chest and neck- Medial ([facility])  Skin excision  Lesion length (cm):  2.5 Lesion width (cm):  1.3 Margin per side (cm):  0.2 Total excision diameter (cm):  2.9 Informed consent: discussed and consent obtained   Timeout: patient name, date of birth, surgical site, and procedure verified   Procedure prep:  Patient was prepped and draped in usual sterile fashion Prep type:  Isopropyl alcohol and povidone-iodine Anesthesia: the lesion was anesthetized in a standard fashion   Anesthetic:  1% lidocaine w/ epinephrine 1-100,000 buffered w/ 8.4% NaHCO3 Instrument used: #15 blade   Hemostasis achieved with: pressure   Hemostasis achieved with comment:  Electrocautery Outcome: patient tolerated procedure well with no complications   Post-procedure details: sterile dressing applied and wound care instructions given   Dressing type: bandage and pressure dressing (mupirocin)    Skin repair Complexity:  Complex Final length (cm):  5 Reason for type of repair: reduce tension to allow closure, reduce the risk of dehiscence, infection, and necrosis, reduce subcutaneous dead space and avoid a hematoma, allow  closure of the large defect, preserve normal anatomy, preserve normal anatomical and functional relationships and enhance both functionality and cosmetic results   Undermining: area extensively undermined   Undermining comment:  Undermining defect 2.9 cm Subcutaneous layers (deep stitches):  Suture size:  2-0 Suture type: Vicryl (polyglactin 910)   Subcutaneous suture technique: inverted dermal. Fine/surface layer approximation (top stitches):  Suture size:  3-0 Suture type: nylon   Stitches: simple running   Suture removal (days):  7 Hemostasis achieved with: suture and pressure Outcome: patient tolerated procedure well with no complications   Post-procedure details: sterile dressing applied and wound care instructions given   Dressing type: bandage and pressure dressing (mupirocin)    mupirocin ointment (BACTROBAN) 2 % Apply 1 application topically daily. With dressing changes  Specimen 1 - Surgical pathology Differential Diagnosis: Biopsy proven BCC Check Margins: Yes [phone]  Return in about 1 week (around 12/28/2020) for suture removal.  I, Ashok Cordia, CMA, am acting as scribe for Sarina Ser, MD .  Documentation: I have reviewed the above documentation for accuracy and completeness, and I agree with the above.  Sarina Ser, MD

## 2020-12-22 ENCOUNTER — Telehealth: Payer: Self-pay

## 2020-12-22 ENCOUNTER — Encounter: Payer: Self-pay | Admitting: Dermatology

## 2020-12-22 NOTE — Telephone Encounter (Signed)
Patient doing fine after yesterdays surgery./sh 

## 2020-12-23 DIAGNOSIS — Z20828 Contact with and (suspected) exposure to other viral communicable diseases: Secondary | ICD-10-CM | POA: Diagnosis not present

## 2020-12-23 DIAGNOSIS — Z1159 Encounter for screening for other viral diseases: Secondary | ICD-10-CM | POA: Diagnosis not present

## 2020-12-28 ENCOUNTER — Encounter: Payer: Self-pay | Admitting: Dermatology

## 2020-12-28 ENCOUNTER — Other Ambulatory Visit: Payer: Self-pay

## 2020-12-28 ENCOUNTER — Ambulatory Visit: Payer: BC Managed Care – PPO | Admitting: Dermatology

## 2020-12-28 DIAGNOSIS — Z4802 Encounter for removal of sutures: Secondary | ICD-10-CM

## 2020-12-28 DIAGNOSIS — Z85828 Personal history of other malignant neoplasm of skin: Secondary | ICD-10-CM

## 2020-12-28 NOTE — Progress Notes (Signed)
   Follow-Up Visit   Subjective  Phillip Perry is a 65 y.o. male who presents for the following: suture removal (Pathology proven margins free BCC - chest extending to neck).  The following portions of the chart were reviewed this encounter and updated as appropriate:   Tobacco  Allergies  Meds  Problems  Med Hx  Surg Hx  Fam Hx     Review of Systems:  No other skin or systemic complaints except as noted in HPI or Assessment and Plan.  Objective  Well appearing patient in no apparent distress; mood and affect are within normal limits.  A focused examination was performed including the chest and neck. Relevant physical exam findings are noted in the Assessment and Plan.  Chest extending into neck Healing excision site.   Assessment & Plan  History of basal cell carcinoma of skin Chest extending into neck  Encounter for Removal of Sutures - Incision site at the chest extending to neck is clean, dry and intact - Wound cleansed, sutures removed, wound cleansed and steri strips applied.  - Discussed pathology results showing margins free BCC.  - Patient advised to keep steri-strips dry until they fall off. - Scars remodel for a full year. - Once steri-strips fall off, patient can apply over-the-counter silicone scar cream each night to help with scar remodeling if desired. - Patient advised to call with any concerns or if they notice any new or changing lesions.   Return for appointment as scheduled.  Luther Redo, CMA, am acting as scribe for Sarina Ser, MD . Documentation: I have reviewed the above documentation for accuracy and completeness, and I agree with the above.  Sarina Ser, MD

## 2020-12-28 NOTE — Patient Instructions (Signed)

## 2021-01-12 ENCOUNTER — Other Ambulatory Visit: Payer: Self-pay | Admitting: Specialist

## 2021-01-12 MED ORDER — METHYLPREDNISOLONE 4 MG PO TBPK: ORAL_TABLET | ORAL | 0 refills | Status: DC

## 2021-01-12 MED ORDER — GABAPENTIN 100 MG PO CAPS: ORAL_CAPSULE | ORAL | 1 refills | Status: DC

## 2021-01-12 MED ORDER — TRAMADOL-ACETAMINOPHEN 37.5-325 MG PO TABS: 1.0000 | ORAL_TABLET | Freq: Four times a day (QID) | ORAL | 0 refills | Status: DC | PRN

## 2021-01-25 ENCOUNTER — Other Ambulatory Visit: Payer: Self-pay | Admitting: Specialist

## 2021-01-25 MED ORDER — TRAMADOL-ACETAMINOPHEN 37.5-325 MG PO TABS: 1.0000 | ORAL_TABLET | Freq: Four times a day (QID) | ORAL | 0 refills | Status: DC | PRN

## 2021-02-07 ENCOUNTER — Ambulatory Visit (INDEPENDENT_AMBULATORY_CARE_PROVIDER_SITE_OTHER): Payer: BC Managed Care – PPO | Admitting: Family Medicine

## 2021-02-07 ENCOUNTER — Other Ambulatory Visit: Payer: Self-pay

## 2021-02-07 ENCOUNTER — Encounter: Payer: Self-pay | Admitting: Family Medicine

## 2021-02-07 VITALS — BP 110/70 | HR 86 | Temp 98.0°F | Ht 75.0 in | Wt 245.8 lb

## 2021-02-07 DIAGNOSIS — E538 Deficiency of other specified B group vitamins: Secondary | ICD-10-CM

## 2021-02-07 DIAGNOSIS — I10 Essential (primary) hypertension: Secondary | ICD-10-CM

## 2021-02-07 DIAGNOSIS — R739 Hyperglycemia, unspecified: Secondary | ICD-10-CM | POA: Diagnosis not present

## 2021-02-07 DIAGNOSIS — Z0001 Encounter for general adult medical examination with abnormal findings: Secondary | ICD-10-CM

## 2021-02-07 DIAGNOSIS — Z13220 Encounter for screening for lipoid disorders: Secondary | ICD-10-CM

## 2021-02-07 DIAGNOSIS — R270 Ataxia, unspecified: Secondary | ICD-10-CM

## 2021-02-07 DIAGNOSIS — Z125 Encounter for screening for malignant neoplasm of prostate: Secondary | ICD-10-CM

## 2021-02-07 LAB — CBC
HCT: 45.6 % (ref 39.0–52.0)
Hemoglobin: 15.4 g/dL (ref 13.0–17.0)
MCHC: 33.7 g/dL (ref 30.0–36.0)
MCV: 90.5 fl (ref 78.0–100.0)
Platelets: 236 10*3/uL (ref 150.0–400.0)
RBC: 5.04 Mil/uL (ref 4.22–5.81)
RDW: 14 % (ref 11.5–15.5)
WBC: 6.6 10*3/uL (ref 4.0–10.5)

## 2021-02-07 LAB — HEMOGLOBIN A1C: Hgb A1c MFr Bld: 6.2 % (ref 4.6–6.5)

## 2021-02-07 MED ORDER — OMEPRAZOLE 20 MG PO CPDR: 20.0000 mg | DELAYED_RELEASE_CAPSULE | Freq: Every day | ORAL | 3 refills | Status: DC

## 2021-02-07 MED ORDER — BENAZEPRIL HCL 20 MG PO TABS: 20.0000 mg | ORAL_TABLET | Freq: Every day | ORAL | 3 refills | Status: DC

## 2021-02-07 NOTE — Assessment & Plan Note (Signed)
Check A1c.  Last A1c 5.8.  Continue metformin 500 mg daily.  A1c continues to be well controlled which likely stop.  He is working hard on lifestyle modifications.

## 2021-02-07 NOTE — Assessment & Plan Note (Signed)
Stable.  Has seen neurology in the past.  Symptoms are stable at this point.
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
## 2021-02-07 NOTE — Assessment & Plan Note (Signed)
At goal on benazepril 20 mg daily.  We will check labs today.

## 2021-02-07 NOTE — Patient Instructions (Addendum)
It was very nice to see you today!  We will check blood work.  Keep up the good work with your diet and exercise.  I will see you back in a year. Come back to see me sooner if needed.  Take care, Dr Jerline Pain  PLEASE NOTE:  If you had any lab tests please let us know if you have not heard back within a few days. You may see your results on mychart before we have a chance to review them but we will give you a call once they are reviewed by [name]. If we ordered any referrals today, please let us know if you have not heard from their office within the next week.   Please try these tips to maintain a healthy lifestyle:  Eat at least 3 REAL meals and 1-2 snacks per day.  Aim for no more than 5 hours between eating.  If you eat breakfast, please do so within one hour of getting up.   Each meal should contain half fruits/vegetables, one quarter protein, and one quarter carbs (no bigger than a computer mouse)  Cut down on sweet beverages. This includes juice, soda, and sweet tea.   Drink at least 1 glass of water with each meal and aim for at least 8 glasses per day  Exercise at least 150 minutes every week.   Preventive Care 43-18 Years Old, Male Preventive care refers to lifestyle choices and visits with your health care provider that can promote health and wellness. This includes: A yearly physical exam. This is also called an annual wellness visit. Regular dental and eye exams. Immunizations. Screening for certain conditions. Healthy lifestyle choices, such as: Eating a healthy diet. Getting regular exercise. Not using drugs or products that contain nicotine and tobacco. Limiting alcohol use. What can I expect for my preventive care visit? Physical exam Your health care provider will check your: Height and weight. These may be used to calculate your BMI (body mass index). BMI is a measurement that tells if you are at a healthy weight. Heart rate and blood pressure. Body  temperature. Skin for abnormal spots. Counseling Your health care provider may ask you questions about your: Past medical problems. Family's medical history. Alcohol, tobacco, and drug use. Emotional well-being. Home life and relationship well-being. Sexual activity. Diet, exercise, and sleep habits. Work and work Statistician. Access to firearms. What immunizations do I need? Vaccines are usually given at various ages, according to a schedule. Your health care provider will recommend vaccines for you based on your age, medical history, and lifestyle or other factors, such as travel or where you work. What tests do I need? Blood tests Lipid and cholesterol levels. These may be checked every 5 years, or more often if you are over 54 years old. Hepatitis C test. Hepatitis B test. Screening Lung cancer screening. You may have this screening every year starting at age 63 if you have a 30-pack-year history of smoking and currently smoke or have quit within the past 15 years. Prostate cancer screening. Recommendations will vary depending on your family history and other risks. Genital exam to check for testicular cancer or hernias. Colorectal cancer screening. All adults should have this screening starting at age 63 and continuing until age 74. Your health care provider may recommend screening at age 18 if you are at increased risk. You will have tests every 1-10 years, depending on your results and the type of screening test. Diabetes screening. This is done by checking your  blood sugar (glucose) after you have not eaten for a while (fasting). You may have this done every 1-3 years. STD (sexually transmitted disease) testing, if you are at risk. Follow these instructions at home: Eating and drinking  Eat a diet that includes fresh fruits and vegetables, whole grains, lean protein, and low-fat dairy products. Take vitamin and mineral supplements as recommended by your health care  provider. Do not drink alcohol if your health care provider tells you not to drink. If you drink alcohol: Limit how much you have to 0-2 drinks a day. Be aware of how much alcohol is in your drink. In the [place] one drink equals one 12 oz bottle of beer (355 mL), one 5 oz glass of wine (148 mL), or one 1 oz glass of hard liquor (44 mL). Lifestyle Take daily care of your teeth and gums. Brush your teeth every morning and night with fluoride toothpaste. Floss one time each day. Stay active. Exercise for at least 30 minutes 5 or more days each week. Do not use any products that contain nicotine or tobacco, such as cigarettes, e-cigarettes, and chewing tobacco. If you need help quitting, ask your health care provider. Do not use drugs. If you are sexually active, practice safe sex. Use a condom or other form of protection to prevent STIs (sexually transmitted infections). If told by your health care provider, take low-dose aspirin daily starting at age 34. Find healthy ways to cope with stress, such as: Meditation, yoga, or listening to music. Journaling. Talking to a trusted person. Spending time with friends and family. Safety Always wear your seat belt while driving or riding in a vehicle. Do not drive: If you have been drinking alcohol. Do not ride with someone who has been drinking. When you are tired or distracted. While texting. Wear a helmet and other protective equipment during sports activities. If you have firearms in your house, make sure you follow all gun safety procedures. What's next? Go to your health care provider once a year for an annual wellness visit. Ask your health care provider how often you should have your eyes and teeth checked. Stay up to date on all vaccines. This information is not intended to replace advice given to you by your health care provider. Make sure you discuss any questions you have with your health care provider. Document Revised: 07/23/2020  Document Reviewed: 05/09/2018 Elsevier Patient Education  2022 [employer].

## 2021-02-08 ENCOUNTER — Other Ambulatory Visit (INDEPENDENT_AMBULATORY_CARE_PROVIDER_SITE_OTHER): Payer: BC Managed Care – PPO

## 2021-02-08 DIAGNOSIS — I10 Essential (primary) hypertension: Secondary | ICD-10-CM

## 2021-02-08 DIAGNOSIS — Z125 Encounter for screening for malignant neoplasm of prostate: Secondary | ICD-10-CM

## 2021-02-08 DIAGNOSIS — Z0000 Encounter for general adult medical examination without abnormal findings: Secondary | ICD-10-CM

## 2021-02-08 DIAGNOSIS — Z0001 Encounter for general adult medical examination with abnormal findings: Secondary | ICD-10-CM

## 2021-02-08 DIAGNOSIS — Z13220 Encounter for screening for lipoid disorders: Secondary | ICD-10-CM

## 2021-02-09 LAB — COMPREHENSIVE METABOLIC PANEL
ALT: 25 U/L (ref 0–53)
AST: 28 U/L (ref 0–37)
Albumin: 4.4 g/dL (ref 3.5–5.2)
Alkaline Phosphatase: 80 U/L (ref 39–117)
BUN: 15 mg/dL (ref 6–23)
CO2: 28 mEq/L (ref 19–32)
Calcium: 9.8 mg/dL (ref 8.4–10.5)
Chloride: 102 mEq/L (ref 96–112)
Creatinine, Ser: 1.02 mg/dL (ref 0.40–1.50)
GFR: 78.34 mL/min (ref >60.00)
Glucose, Bld: 90 mg/dL (ref 70–99)
Potassium: 4.7 mEq/L (ref 3.5–5.1)
Sodium: 139 mEq/L (ref 135–145)
Total Bilirubin: 0.5 mg/dL (ref 0.2–1.2)
Total Protein: 6.8 g/dL (ref 6.0–8.3)

## 2021-02-09 LAB — LIPID PANEL
Cholesterol: 170 mg/dL (ref 0–200)
HDL: 34.3 mg/dL — ABNORMAL LOW (ref >39.00)
NonHDL: 136
Total CHOL/HDL Ratio: 5
Triglycerides: 213 mg/dL — ABNORMAL HIGH (ref 0.0–149.0)
VLDL: 42.6 mg/dL — ABNORMAL HIGH (ref 0.0–40.0)

## 2021-02-09 LAB — LDL CHOLESTEROL, DIRECT: Direct LDL: 97 mg/dL

## 2021-02-09 LAB — TSH: TSH: 2.06 u[IU]/mL (ref 0.35–5.50)

## 2021-02-09 LAB — PSA: PSA: 1.03 ng/mL (ref 0.10–4.00)

## 2021-02-14 NOTE — Progress Notes (Signed)
Please inform patient of the following:  Labs are all STABLE. Would like for him to keep up the good work and we can recheck in a year or so.  Phillip Perry. Jerline Pain, MD 02/14/2021 8:10 AM

## 2021-02-24 ENCOUNTER — Telehealth: Payer: Self-pay

## 2021-02-24 NOTE — Telephone Encounter (Signed)
Immunization record updated.

## 2021-02-24 NOTE — Telephone Encounter (Signed)
Pt called to inform that he received his flu shot today and he would like it to be updated in his chart. Please Advise.

## 2021-04-14 ENCOUNTER — Other Ambulatory Visit: Payer: Self-pay

## 2021-04-14 ENCOUNTER — Ambulatory Visit: Payer: BC Managed Care – PPO | Admitting: Dermatology

## 2021-04-14 DIAGNOSIS — D485 Neoplasm of uncertain behavior of skin: Secondary | ICD-10-CM

## 2021-04-14 DIAGNOSIS — D692 Other nonthrombocytopenic purpura: Secondary | ICD-10-CM | POA: Diagnosis not present

## 2021-04-14 DIAGNOSIS — L578 Other skin changes due to chronic exposure to nonionizing radiation: Secondary | ICD-10-CM

## 2021-04-14 DIAGNOSIS — D229 Melanocytic nevi, unspecified: Secondary | ICD-10-CM

## 2021-04-14 DIAGNOSIS — Z1283 Encounter for screening for malignant neoplasm of skin: Secondary | ICD-10-CM

## 2021-04-14 DIAGNOSIS — I781 Nevus, non-neoplastic: Secondary | ICD-10-CM | POA: Diagnosis not present

## 2021-04-14 DIAGNOSIS — C44619 Basal cell carcinoma of skin of left upper limb, including shoulder: Secondary | ICD-10-CM

## 2021-04-14 DIAGNOSIS — D1801 Hemangioma of skin and subcutaneous tissue: Secondary | ICD-10-CM

## 2021-04-14 DIAGNOSIS — L821 Other seborrheic keratosis: Secondary | ICD-10-CM

## 2021-04-14 DIAGNOSIS — L570 Actinic keratosis: Secondary | ICD-10-CM | POA: Diagnosis not present

## 2021-04-14 DIAGNOSIS — L814 Other melanin hyperpigmentation: Secondary | ICD-10-CM

## 2021-04-14 NOTE — Progress Notes (Signed)
Follow-Up Visit   Subjective  Phillip Perry is a 65 y.o. male who presents for the following: Actinic Keratosis (Face treated with LN2) and Other (Spot of left upper arm that he picked and left a red mark). The patient presents for Upper Body Skin Exam (UBSE) for skin cancer screening and mole check.  The following portions of the chart were reviewed this encounter and updated as appropriate:   Tobacco  Allergies  Meds  Problems  Med Hx  Surg Hx  Fam Hx     Review of Systems:  No other skin or systemic complaints except as noted in HPI or Assessment and Plan.  Objective  Well appearing patient in no apparent distress; mood and affect are within normal limits.  All skin waist up examined.  Left lat bicep 0.6 cm pink crusted flat papule       Arms Purpura  Left Forearm - Posterior Dilated blood vessels  Forehead/scalp (5) Erythematous thin papules/macules with gritty scale.    Assessment & Plan   Lentigines - Scattered tan macules - Due to sun exposure - Benign-appearing, observe - Recommend daily broad spectrum sunscreen SPF 30+ to sun-exposed areas, reapply every 2 hours as needed. - Call for any changes  Seborrheic Keratoses - Stuck-on, waxy, tan-brown papules and/or plaques  - Benign-appearing - Discussed benign etiology and prognosis. - Observe - Call for any changes  Melanocytic Nevi - Tan-brown and/or pink-flesh-colored symmetric macules and papules - Benign appearing on exam today - Observation - Call clinic for new or changing moles - Recommend daily use of broad spectrum spf 30+ sunscreen to sun-exposed areas.   Hemangiomas - Red papules - Discussed benign nature - Observe - Call for any changes  Actinic Damage - Chronic condition, secondary to cumulative UV/sun exposure - diffuse scaly erythematous macules with underlying dyspigmentation - Recommend daily broad spectrum sunscreen SPF 30+ to sun-exposed areas, reapply every 2 hours as  needed.  - Staying in the shade or wearing long sleeves, sun glasses (UVA+UVB protection) and wide brim hats (4-inch brim around the entire circumference of the hat) are also recommended for sun protection.  - Call for new or changing lesions.  Skin cancer screening performed today.  Neoplasm of uncertain behavior of skin Left lat bicep  Epidermal / dermal shaving  Lesion diameter (cm):  0.6 Informed consent: discussed and consent obtained   Timeout: patient name, date of birth, surgical site, and procedure verified   Procedure prep:  Patient was prepped and draped in usual sterile fashion Prep type:  Isopropyl alcohol Anesthesia: the lesion was anesthetized in a standard fashion   Anesthetic:  1% lidocaine w/ epinephrine 1-100,000 buffered w/ 8.4% NaHCO3 Instrument used: flexible razor blade   Hemostasis achieved with: pressure, aluminum chloride and electrodesiccation   Outcome: patient tolerated procedure well   Post-procedure details: sterile dressing applied and wound care instructions given   Dressing type: bandage and petrolatum    Destruction of lesion Complexity: extensive   Destruction method: electrodesiccation and curettage   Informed consent: discussed and consent obtained   Timeout:  patient name, date of birth, surgical site, and procedure verified Procedure prep:  Patient was prepped and draped in usual sterile fashion Prep type:  Isopropyl alcohol Anesthesia: the lesion was anesthetized in a standard fashion   Anesthetic:  1% lidocaine w/ epinephrine 1-100,000 buffered w/ 8.4% NaHCO3 Curettage performed in three different directions: Yes   Electrodesiccation performed over the curetted area: Yes   Lesion length (cm):  0.6 Lesion width (cm):  0.6 Margin per side (cm):  0.3 Final wound size (cm):  1.2 Hemostasis achieved with:  pressure, aluminum chloride and electrodesiccation Outcome: patient tolerated procedure well with no complications   Post-procedure  details: sterile dressing applied and wound care instructions given   Dressing type: bandage and petrolatum    Specimen 1 - Surgical pathology Differential Diagnosis: BCC vs other  Check Margins: No EDC today  Telangiectasia Left Forearm - Posterior Benign-appearing.  Observation.  Call clinic for new or changing lesions.  Recommend daily use of broad spectrum spf 30+ sunscreen to sun-exposed areas.  Discussed laser/BBL.  Purpura - Chronic; persistent and recurrent.  Treatable, but not curable. - Violaceous macules and patches - Benign - Related to trauma, age, sun damage and/or use of blood thinners, chronic use of topical and/or oral steroids - Observe - Can use OTC arnica containing moisturizer such as Dermend Bruise Formula if desired - Call for worsening or other concerns  AK (actinic keratosis) (5) Forehead/scalp  Destruction of lesion - Forehead/scalp Complexity: simple   Destruction method: cryotherapy   Informed consent: discussed and consent obtained   Timeout:  patient name, date of birth, surgical site, and procedure verified Lesion destroyed using liquid nitrogen: Yes   Region frozen until ice ball extended beyond lesion: Yes   Outcome: patient tolerated procedure well with no complications   Post-procedure details: wound care instructions given    Skin cancer screening  Return in about 6 months (around 10/12/2021) for TBSE.  I, Ashok Cordia, CMA, am acting as scribe for Sarina Ser, MD . Documentation: I have reviewed the above documentation for accuracy and completeness, and I agree with the above.  Sarina Ser, MD

## 2021-04-14 NOTE — Patient Instructions (Addendum)
Cryotherapy Aftercare  Wash gently with soap and water everyday.   Apply Vaseline and Band-Aid daily until healed.    Wound Care Instructions  Cleanse wound gently with soap and water once a day then pat dry with clean gauze. Apply a thing coat of Petrolatum (petroleum jelly, "Vaseline") over the wound (unless you have an allergy to this). We recommend that you use a new, sterile tube of Vaseline. Do not pick or remove scabs. Do not remove the yellow or white "healing tissue" from the base of the wound.  Cover the wound with fresh, clean, nonstick gauze and secure with paper tape. You may use Band-Aids in place of gauze and tape if the would is small enough, but would recommend trimming much of the tape off as there is often too much. Sometimes Band-Aids can irritate the skin.  You should call the office for your biopsy report after 1 week if you have not already been contacted.  If you experience any problems, such as abnormal amounts of bleeding, swelling, significant bruising, significant pain, or evidence of infection, please call the office immediately.  FOR ADULT SURGERY PATIENTS: If you need something for pain relief you may take 1 extra strength Tylenol (acetaminophen) AND 2 Ibuprofen (200mg each) together every 4 hours as needed for pain. (do not take these if you are allergic to them or if you have a reason you should not take them.) Typically, you may only need pain medication for 1 to 3 days.        If You Need Anything After Your Visit  If you have any questions or concerns for your doctor, please call our main line at [phone] and press option 4 to reach your doctor's medical assistant. If no one answers, please leave a voicemail as directed and we will return your call as soon as possible. Messages left after 4 pm will be answered the following business day.   You may also send us a message via MyChart. We typically respond to MyChart messages within 1-2 business  days.  For prescription refills, please ask your pharmacy to contact our office. Our fax number is [phone].  If you have an urgent issue when the clinic is closed that cannot wait until the next business day, you can page your doctor at the number below.    Please note that while we do our best to be available for urgent issues outside of office hours, we are not available 24/7.   If you have an urgent issue and are unable to reach us, you may choose to seek medical care at your doctor's office, retail clinic, urgent care center, or emergency room.  If you have a medical emergency, please immediately call 911 or go to the emergency department.  Pager Numbers  - Dr. Kowalski: [phone]  - Dr. Moye: [phone]  - Dr. Stewart: [phone]  In the event of inclement weather, please call our main line at [phone] for an update on the status of any delays or closures.  Dermatology Medication Tips: Please keep the boxes that topical medications come in in order to help keep track of the instructions about where and how to use these. Pharmacies typically print the medication instructions only on the boxes and not directly on the medication tubes.   If your medication is too expensive, please contact our office at [phone] option 4 or send us a message through MyChart.   We are unable to tell what your co-pay for medications will be   in advance as this is different depending on your insurance coverage. However, we may be able to find a substitute medication at lower cost or fill out paperwork to get insurance to cover a needed medication.   If a prior authorization is required to get your medication covered by your insurance company, please allow us 1-2 business days to complete this process.  Drug prices often vary depending on where the prescription is filled and some pharmacies may offer cheaper prices.  The website www.goodrx.com contains coupons for medications through  different pharmacies. The prices here do not account for what the cost may be with help from insurance (it may be cheaper with your insurance), but the website can give you the price if you did not use any insurance.  - You can print the associated coupon and take it with your prescription to the pharmacy.  - You may also stop by our office during regular business hours and pick up a GoodRx coupon card.  - If you need your prescription sent electronically to a different pharmacy, notify our office through [facility] MyChart or by phone at [phone] option 4.     Si Usted Necesita Algo Despus de Su Visita  Tambin puede enviarnos un mensaje a travs de MyChart. Por lo general respondemos a los mensajes de MyChart en el transcurso de 1 a 2 das hbiles.  Para renovar recetas, por favor pida a su farmacia que se ponga en contacto con nuestra oficina. Nuestro nmero de fax es el [phone].  Si tiene un asunto urgente cuando la clnica est cerrada y que no puede esperar hasta el siguiente da hbil, puede llamar/localizar a su doctor(a) al nmero que aparece a continuacin.   Por favor, tenga en cuenta que aunque hacemos todo lo posible para estar disponibles para asuntos urgentes fuera del horario de oficina, no estamos disponibles las 24 horas del da, los 7 das de la semana.   Si tiene un problema urgente y no puede comunicarse con nosotros, puede optar por buscar atencin mdica  en el consultorio de su doctor(a), en una clnica privada, en un centro de atencin urgente o en una sala de emergencias.  Si tiene una emergencia mdica, por favor llame inmediatamente al 911 o vaya a la sala de emergencias.  Nmeros de bper  - Dr. Kowalski: [phone]  - Dra. Moye: [phone]  - Dra. Stewart: [phone]  En caso de inclemencias del tiempo, por favor llame a nuestra lnea principal al [phone] para una actualizacin sobre el estado de cualquier retraso o cierre.  Consejos  para la medicacin en dermatologa: Por favor, guarde las cajas en las que vienen los medicamentos de uso tpico para ayudarle a seguir las instrucciones sobre dnde y cmo usarlos. Las farmacias generalmente imprimen las instrucciones del medicamento slo en las cajas y no directamente en los tubos del medicamento.   Si su medicamento es muy caro, por favor, pngase en contacto con nuestra oficina llamando al [phone] y presione la opcin 4 o envenos un mensaje a travs de MyChart.   No podemos decirle cul ser su copago por los medicamentos por adelantado ya que esto es diferente dependiendo de la cobertura de su seguro. Sin embargo, es posible que podamos encontrar un medicamento sustituto a menor costo o llenar un formulario para que el seguro cubra el medicamento que se considera necesario.   Si se requiere una autorizacin previa para que su compaa de seguros cubra su medicamento, por favor permtanos de 1 a   2 das hbiles para completar este proceso.  Los precios de los medicamentos varan con frecuencia dependiendo del lugar de dnde se surte la receta y alguna farmacias pueden ofrecer precios ms baratos.  El sitio web www.goodrx.com tiene cupones para medicamentos de diferentes farmacias. Los precios aqu no tienen en cuenta lo que podra costar con la ayuda del seguro (puede ser ms barato con su seguro), pero el sitio web puede darle el precio si no utiliz ningn seguro.  - Puede imprimir el cupn correspondiente y llevarlo con su receta a la farmacia.  - Tambin puede pasar por nuestra oficina durante el horario de atencin regular y recoger una tarjeta de cupones de GoodRx.  - Si necesita que su receta se enve electrnicamente a una farmacia diferente, informe a nuestra oficina a travs de MyChart de [place] o por telfono llamando al [phone] y presione la opcin 4.  

## 2021-04-19 ENCOUNTER — Telehealth: Payer: Self-pay

## 2021-04-19 NOTE — Telephone Encounter (Signed)
-----   Message from Ralene Bathe, MD sent at 04/18/2021  5:42 PM EST ----- Diagnosis Skin , left lat bicep SUPERFICIAL AND NODULAR BASAL CELL CARCINOMA  Cancer - [place] Already treated Recheck next visit

## 2021-04-19 NOTE — Telephone Encounter (Signed)
Advised patient of results/hd  

## 2021-04-29 ENCOUNTER — Encounter: Payer: Self-pay | Admitting: Dermatology

## 2021-05-16 ENCOUNTER — Other Ambulatory Visit: Payer: Self-pay | Admitting: Family Medicine

## 2021-10-13 ENCOUNTER — Ambulatory Visit: Payer: BC Managed Care – PPO | Admitting: Dermatology

## 2022-02-09 ENCOUNTER — Other Ambulatory Visit: Payer: Self-pay | Admitting: Family Medicine

## 2022-02-09 DIAGNOSIS — I10 Essential (primary) hypertension: Secondary | ICD-10-CM

## 2022-02-13 ENCOUNTER — Ambulatory Visit (INDEPENDENT_AMBULATORY_CARE_PROVIDER_SITE_OTHER): Payer: BC Managed Care – PPO | Admitting: Family Medicine

## 2022-02-13 ENCOUNTER — Encounter: Payer: Self-pay | Admitting: Family Medicine

## 2022-02-13 ENCOUNTER — Other Ambulatory Visit: Payer: Self-pay | Admitting: Family Medicine

## 2022-02-13 VITALS — BP 109/74 | HR 96 | Temp 97.9°F | Ht 75.0 in | Wt 247.0 lb

## 2022-02-13 DIAGNOSIS — Z114 Encounter for screening for human immunodeficiency virus [HIV]: Secondary | ICD-10-CM | POA: Diagnosis not present

## 2022-02-13 DIAGNOSIS — Z0001 Encounter for general adult medical examination with abnormal findings: Secondary | ICD-10-CM | POA: Diagnosis not present

## 2022-02-13 DIAGNOSIS — Z23 Encounter for immunization: Secondary | ICD-10-CM

## 2022-02-13 DIAGNOSIS — Z13220 Encounter for screening for lipoid disorders: Secondary | ICD-10-CM | POA: Diagnosis not present

## 2022-02-13 DIAGNOSIS — E538 Deficiency of other specified B group vitamins: Secondary | ICD-10-CM | POA: Diagnosis not present

## 2022-02-13 DIAGNOSIS — I10 Essential (primary) hypertension: Secondary | ICD-10-CM | POA: Diagnosis not present

## 2022-02-13 DIAGNOSIS — R270 Ataxia, unspecified: Secondary | ICD-10-CM | POA: Diagnosis not present

## 2022-02-13 DIAGNOSIS — R739 Hyperglycemia, unspecified: Secondary | ICD-10-CM

## 2022-02-13 DIAGNOSIS — Z125 Encounter for screening for malignant neoplasm of prostate: Secondary | ICD-10-CM

## 2022-02-13 DIAGNOSIS — I8390 Asymptomatic varicose veins of unspecified lower extremity: Secondary | ICD-10-CM

## 2022-02-13 DIAGNOSIS — Z1159 Encounter for screening for other viral diseases: Secondary | ICD-10-CM

## 2022-02-13 DIAGNOSIS — K219 Gastro-esophageal reflux disease without esophagitis: Secondary | ICD-10-CM

## 2022-02-13 LAB — LIPID PANEL
Cholesterol: 189 mg/dL (ref 0–200)
HDL: 37.3 mg/dL — ABNORMAL LOW (ref >39.00)
LDL Cholesterol: 118 mg/dL — ABNORMAL HIGH (ref 0–99)
NonHDL: 151.74
Total CHOL/HDL Ratio: 5
Triglycerides: 171 mg/dL — ABNORMAL HIGH (ref 0.0–149.0)
VLDL: 34.2 mg/dL (ref 0.0–40.0)

## 2022-02-13 LAB — COMPREHENSIVE METABOLIC PANEL
ALT: 37 U/L (ref 0–53)
AST: 38 U/L — ABNORMAL HIGH (ref 0–37)
Albumin: 4.9 g/dL (ref 3.5–5.2)
Alkaline Phosphatase: 78 U/L (ref 39–117)
BUN: 18 mg/dL (ref 6–23)
CO2: 25 mEq/L (ref 19–32)
Calcium: 10.2 mg/dL (ref 8.4–10.5)
Chloride: 103 mEq/L (ref 96–112)
Creatinine, Ser: 1.09 mg/dL (ref 0.40–1.50)
GFR: 71.83 mL/min (ref >60.00)
Glucose, Bld: 101 mg/dL — ABNORMAL HIGH (ref 70–99)
Potassium: 4.7 mEq/L (ref 3.5–5.1)
Sodium: 139 mEq/L (ref 135–145)
Total Bilirubin: 0.7 mg/dL (ref 0.2–1.2)
Total Protein: 7.9 g/dL (ref 6.0–8.3)

## 2022-02-13 LAB — VITAMIN B12: Vitamin B-12: 839 pg/mL (ref 211–911)

## 2022-02-13 LAB — CBC
HCT: 47.2 % (ref 39.0–52.0)
Hemoglobin: 16.2 g/dL (ref 13.0–17.0)
MCHC: 34.4 g/dL (ref 30.0–36.0)
MCV: 90 fl (ref 78.0–100.0)
Platelets: 221 10*3/uL (ref 150.0–400.0)
RBC: 5.24 Mil/uL (ref 4.22–5.81)
RDW: 13.9 % (ref 11.5–15.5)
WBC: 7.5 10*3/uL (ref 4.0–10.5)

## 2022-02-13 LAB — TSH: TSH: 3.44 u[IU]/mL (ref 0.35–5.50)

## 2022-02-13 LAB — HEMOGLOBIN A1C: Hgb A1c MFr Bld: 6.2 % (ref 4.6–6.5)

## 2022-02-13 LAB — PSA: PSA: 1.51 ng/mL (ref 0.10–4.00)

## 2022-02-13 NOTE — Assessment & Plan Note (Signed)
Continue conservative management.  We discussed referral to vascular surgery however he declined.

## 2022-02-13 NOTE — Assessment & Plan Note (Signed)
Check B12.  He is on omeprazole '20mg'$  dialy.

## 2022-02-13 NOTE — Assessment & Plan Note (Signed)
Stable 1000 mcg daily.  Check B12 level today.
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
## 2022-02-13 NOTE — Addendum Note (Signed)
Addended by: Betti Cruz on: 02/13/2022 02:14 PM   Modules accepted: Orders

## 2022-02-13 NOTE — Assessment & Plan Note (Signed)
At goal today on benazepril 20 mg daily.  Check labs.

## 2022-02-13 NOTE — Patient Instructions (Signed)
It was very nice to see you today!  We will check blood work.  We will give your shingles vaccine today.  Please continue to work on the diet and exercise.  I will see you back in a year for your next physical.  Please come back to see me sooner if needed.  Take care, Dr Jerline Pain  PLEASE NOTE:  If you had any lab tests please let us know if you have not heard back within a few days. You may see your results on mychart before we have a chance to review them but we will give you a call once they are reviewed by [name]. If we ordered any referrals today, please let us know if you have not heard from their office within the next week.   Please try these tips to maintain a healthy lifestyle:  Eat at least 3 REAL meals and 1-2 snacks per day.  Aim for no more than 5 hours between eating.  If you eat breakfast, please do so within one hour of getting up.   Each meal should contain half fruits/vegetables, one quarter protein, and one quarter carbs (no bigger than a computer mouse)  Cut down on sweet beverages. This includes juice, soda, and sweet tea.   Drink at least 1 glass of water with each meal and aim for at least 8 glasses per day  Exercise at least 150 minutes every week.    Preventive Care 71-13 Years Old, Male Preventive care refers to lifestyle choices and visits with your health care provider that can promote health and wellness. Preventive care visits are also called wellness exams. What can I expect for my preventive care visit? Counseling During your preventive care visit, your health care provider may ask about your: Medical history, including: Past medical problems. Family medical history. Current health, including: Emotional well-being. Home life and relationship well-being. Sexual activity. Lifestyle, including: Alcohol, nicotine or tobacco, and drug use. Access to firearms. Diet, exercise, and sleep habits. Safety issues such as seatbelt and bike helmet  use. Sunscreen use. Work and work Statistician. Physical exam Your health care provider will check your: Height and weight. These may be used to calculate your BMI (body mass index). BMI is a measurement that tells if you are at a healthy weight. Waist circumference. This measures the distance around your waistline. This measurement also tells if you are at a healthy weight and may help predict your risk of certain diseases, such as type 2 diabetes and high blood pressure. Heart rate and blood pressure. Body temperature. Skin for abnormal spots. What immunizations do I need?  Vaccines are usually given at various ages, according to a schedule. Your health care provider will recommend vaccines for you based on your age, medical history, and lifestyle or other factors, such as travel or where you work. What tests do I need? Screening Your health care provider may recommend screening tests for certain conditions. This may include: Lipid and cholesterol levels. Diabetes screening. This is done by checking your blood sugar (glucose) after you have not eaten for a while (fasting). Hepatitis B test. Hepatitis C test. HIV (human immunodeficiency virus) test. STI (sexually transmitted infection) testing, if you are at risk. Lung cancer screening. Prostate cancer screening. Colorectal cancer screening. Talk with your health care provider about your test results, treatment options, and if necessary, the need for more tests. Follow these instructions at home: Eating and drinking  Eat a diet that includes fresh fruits and vegetables, whole grains,  lean protein, and low-fat dairy products. Take vitamin and mineral supplements as recommended by your health care provider. Do not drink alcohol if your health care provider tells you not to drink. If you drink alcohol: Limit how much you have to 0-2 drinks a day. Know how much alcohol is in your drink. In the [place] one drink equals one 12 oz bottle of  beer (355 mL), one 5 oz glass of wine (148 mL), or one 1 oz glass of hard liquor (44 mL). Lifestyle Brush your teeth every morning and night with fluoride toothpaste. Floss one time each day. Exercise for at least 30 minutes 5 or more days each week. Do not use any products that contain nicotine or tobacco. These products include cigarettes, chewing tobacco, and vaping devices, such as e-cigarettes. If you need help quitting, ask your health care provider. Do not use drugs. If you are sexually active, practice safe sex. Use a condom or other form of protection to prevent STIs. Take aspirin only as told by your health care provider. Make sure that you understand how much to take and what form to take. Work with your health care provider to find out whether it is safe and beneficial for you to take aspirin daily. Find healthy ways to manage stress, such as: Meditation, yoga, or listening to music. Journaling. Talking to a trusted person. Spending time with friends and family. Minimize exposure to UV radiation to reduce your risk of skin cancer. Safety Always wear your seat belt while driving or riding in a vehicle. Do not drive: If you have been drinking alcohol. Do not ride with someone who has been drinking. When you are tired or distracted. While texting. If you have been using any mind-altering substances or drugs. Wear a helmet and other protective equipment during sports activities. If you have firearms in your house, make sure you follow all gun safety procedures. What's next? Go to your health care provider once a year for an annual wellness visit. Ask your health care provider how often you should have your eyes and teeth checked. Stay up to date on all vaccines. This information is not intended to replace advice given to you by your health care provider. Make sure you discuss any questions you have with your health care provider. Document Revised: 11/10/2020 Document Reviewed:  11/10/2020 Elsevier Patient Education  [employer].

## 2022-02-13 NOTE — Assessment & Plan Note (Signed)
Symptoms are overall stable.  Still has some balance issues.  We discussed referral to see neurology again versus referral to tertiary care center.  Patient declined.  We will continue with watchful waiting for now.

## 2022-02-13 NOTE — Assessment & Plan Note (Signed)
Check A1c.  He is on metformin 500 mg daily.  Can stop metformin if A1c is well controlled.

## 2022-02-14 LAB — HEPATITIS C ANTIBODY: Hepatitis C Ab: NONREACTIVE

## 2022-02-14 LAB — HIV ANTIBODY (ROUTINE TESTING W REFLEX): HIV 1&2 Ab, 4th Generation: NONREACTIVE

## 2022-02-16 ENCOUNTER — Encounter: Payer: Self-pay | Admitting: Family Medicine

## 2022-02-16 DIAGNOSIS — H524 Presbyopia: Secondary | ICD-10-CM | POA: Diagnosis not present

## 2022-02-16 DIAGNOSIS — H52223 Regular astigmatism, bilateral: Secondary | ICD-10-CM | POA: Diagnosis not present

## 2022-02-16 DIAGNOSIS — H5213 Myopia, bilateral: Secondary | ICD-10-CM | POA: Diagnosis not present

## 2022-02-16 DIAGNOSIS — H2513 Age-related nuclear cataract, bilateral: Secondary | ICD-10-CM | POA: Diagnosis not present

## 2022-02-16 DIAGNOSIS — H532 Diplopia: Secondary | ICD-10-CM | POA: Diagnosis not present

## 2022-02-16 DIAGNOSIS — H35411 Lattice degeneration of retina, right eye: Secondary | ICD-10-CM | POA: Diagnosis not present

## 2022-02-16 DIAGNOSIS — E785 Hyperlipidemia, unspecified: Secondary | ICD-10-CM | POA: Insufficient documentation

## 2022-02-16 DIAGNOSIS — H31092 Other chorioretinal scars, left eye: Secondary | ICD-10-CM | POA: Diagnosis not present

## 2022-02-16 NOTE — Progress Notes (Signed)
Please inform patient of the following:  Cholesterol is borderline elevated.  Recommend starting statin to lower numbers and low risk heart attack and stroke.  Please send in Lipitor 40 mg daily if he is willing to start.  His A1c is about the same as last time.  It is okay for him to stop the metformin but we should recheck again in 6 to 12 months.  Everything else is normal.  He should continue to work on diet and exercise and we can recheck in a year.

## 2022-02-22 ENCOUNTER — Telehealth: Payer: Self-pay | Admitting: Family Medicine

## 2022-02-22 NOTE — Telephone Encounter (Signed)
Patient returned call. Requests to be called at ph# [phone] (Patient gives permission to leave a detailed message on voice mail unless it is something serious)

## 2022-02-23 NOTE — Telephone Encounter (Signed)
See results note. 

## 2022-03-13 ENCOUNTER — Other Ambulatory Visit: Payer: Self-pay | Admitting: Family Medicine

## 2022-03-13 DIAGNOSIS — I10 Essential (primary) hypertension: Secondary | ICD-10-CM

## 2022-04-11 ENCOUNTER — Encounter: Payer: Self-pay | Admitting: Orthopedic Surgery

## 2022-04-11 ENCOUNTER — Ambulatory Visit (INDEPENDENT_AMBULATORY_CARE_PROVIDER_SITE_OTHER): Payer: BC Managed Care – PPO

## 2022-04-11 ENCOUNTER — Ambulatory Visit: Payer: BC Managed Care – PPO | Admitting: Orthopedic Surgery

## 2022-04-11 VITALS — BP 127/88 | HR 78 | Ht 75.0 in | Wt 247.0 lb

## 2022-04-11 DIAGNOSIS — M542 Cervicalgia: Secondary | ICD-10-CM
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
## 2022-04-17 ENCOUNTER — Encounter: Payer: Self-pay | Admitting: Family Medicine

## 2022-04-17 ENCOUNTER — Ambulatory Visit (INDEPENDENT_AMBULATORY_CARE_PROVIDER_SITE_OTHER): Payer: BC Managed Care – PPO | Admitting: Family Medicine

## 2022-04-17 VITALS — BP 126/80 | HR 81 | Temp 97.9°F | Ht 75.0 in | Wt 251.8 lb

## 2022-04-17 DIAGNOSIS — U071 COVID-19: Secondary | ICD-10-CM | POA: Diagnosis not present

## 2022-04-17 LAB — POC COVID19 BINAXNOW: SARS Coronavirus 2 Ag: POSITIVE — AB

## 2022-04-17 NOTE — Patient Instructions (Signed)
Please follow up if symptoms do not improve or as needed.    COVID-19 COVID-19, or coronavirus disease 2019, is an infection that is caused by a new (novel) coronavirus called SARS-CoV-2. COVID-19 can cause many symptoms. In some people, the virus may not cause any symptoms. In others, it may cause mild or severe symptoms. Some people with severe infection develop severe disease. What are the causes? This illness is caused by a virus. The virus may be in the air as tiny specks of fluid (aerosols) or droplets, or it may be on surfaces. You may catch the virus by: Breathing in droplets from an infected person. Droplets can be spread by a person breathing, speaking, singing, coughing, or sneezing. Touching something, like a table or a doorknob, that has virus on it (is contaminated) and then touching your mouth, nose, or eyes. What increases the risk? Risk for infection: You are more likely to get infected with the COVID-19 virus if: You are within 6 ft (1.8 m) of a person with COVID-19 for 15 minutes or longer. You are providing care for a person who is infected with COVID-19. You are in close personal contact with other people. Close personal contact includes hugging, kissing, or sharing eating or drinking utensils. Risk for serious illness caused by COVID-19: You are more likely to get seriously ill from the COVID-19 virus if: You have cancer. You have a long-term (chronic) disease, such as: Chronic lung disease. This includes pulmonary embolism, chronic obstructive pulmonary disease, and cystic fibrosis. Long-term disease that lowers your body's ability to fight infection (immunocompromise). Serious cardiac conditions, such as heart failure, coronary artery disease, or cardiomyopathy. Diabetes. Chronic kidney disease. Liver diseases. These include cirrhosis, nonalcoholic fatty liver disease, alcoholic liver disease, or autoimmune hepatitis. You have obesity. You are pregnant or were  recently pregnant. You have sickle cell disease. What are the signs or symptoms? Symptoms of this condition can range from mild to severe. Symptoms may appear any time from 2 to 14 days after being exposed to the virus. They include: Fever or chills. Shortness of breath or trouble breathing. Feeling tired or very tired. Headaches, body aches, or muscle aches. Runny or stuffy nose, sneezing, coughing, or sore throat. New loss of taste or smell. This is rare. Some people may also have stomach problems, such as nausea, vomiting, or diarrhea. Other people may not have any symptoms of COVID-19. How is this diagnosed? This condition may be diagnosed by testing samples to check for the COVID-19 virus. The most common tests are the PCR test and the antigen test. Tests may be done in the lab or at home. They include: Using a swab to take a sample of fluid from the back of your nose and throat (nasopharyngeal fluid), from your nose, or from your throat. Testing a sample of saliva from your mouth. Testing a sample of coughed-up mucus from your lungs (sputum). How is this treated? Treatment for COVID-19 infection depends on the severity of the condition. Mild symptoms can be managed at home with rest, fluids, and over-the-counter medicines. Serious symptoms may be treated in a hospital intensive care unit (ICU). Treatment in the ICU may include: Supplemental oxygen. Extra oxygen is given through a tube in the nose, a face mask, or a hood. Medicines. These may include: Antivirals, such as monoclonal antibodies. These help your body fight off certain viruses that can cause disease. Anti-inflammatories, such as corticosteroids. These reduce inflammation and suppress the immune system. Antithrombotics. These prevent or treat  blood clots, if they develop. Convalescent plasma. This helps boost your immune system, if you have an underlying immunosuppressive condition or are getting immunosuppressive  treatments. Prone positioning. This means you will lie on your stomach. This helps oxygen to get into your lungs. Infection control measures. If you are at risk for more serious illness caused by COVID-19, your health care provider may prescribe two long-acting monoclonal antibodies, given together every 6 months. How is this prevented? To protect yourself: Use preventive medicine (pre-exposure prophylaxis). You may get pre-exposure prophylaxis if you have moderate or severe immunocompromise. Get vaccinated. Anyone 36 months old or older who meets guidelines can get a COVID-19 vaccine or vaccine series. This includes people who are pregnant or making breast milk (lactating). Get an added dose of COVID-19 vaccine after your first vaccine or vaccine series if you have moderate to severe immunocompromise. This applies if you have had a solid organ transplant or have been diagnosed with an immunocompromising condition. You should get the added dose 4 weeks after you got the first COVID-19 vaccine or vaccine series. If you get an mRNA vaccine, you will need a 3-dose primary series. If you get the J&J/Janssen vaccine, you will need a 2-dose primary series, with the second dose being an mRNA vaccine. Talk to your health care provider about getting experimental monoclonal antibodies. This treatment is approved under emergency use authorization to prevent severe illness before or after being exposed to the COVID-19 virus. You may be given monoclonal antibodies if: You have moderate or severe immunocompromise. This includes treatments that lower your immune response. People with immunocompromise may not develop protection against COVID-19 when they are vaccinated. You cannot be vaccinated. You may not get a vaccine if you have a severe allergic reaction to the vaccine or its components. You are not fully vaccinated. You are in a facility where COVID-19 is present and: Are in close contact with a person who is  infected with the COVID-19 virus. Are at high risk of being exposed to the COVID-19 virus. You are at risk of illness from new variants of the COVID-19 virus. To protect others: If you have symptoms of COVID-19, take steps to prevent the virus from spreading to others. Stay home. Leave your house only to get medical care. Do not use public transit, if possible. Do not travel while you are sick. Wash your hands often with soap and water for at least 20 seconds. If soap and water are not available, use alcohol-based hand sanitizer. Make sure that all people in your household wash their hands well and often. Cough or sneeze into a tissue or your sleeve or elbow. Do not cough or sneeze into your hand or into the air. Where to find more information Centers for Disease Control and Prevention: CharmC[employer].be World Health Organization: https://www.castaneda.info/ Get help right away if: You have trouble breathing. You have pain or pressure in your chest. You are confused. You have bluish lips and fingernails. You have trouble waking from sleep. You have symptoms that get worse. These symptoms may be an emergency. Get help right away. Call 911. Do not wait to see if the symptoms will go away. Do not drive yourself to the hospital. Summary COVID-19 is an infection that is caused by a new coronavirus. Sometimes, there are no symptoms. Other times, symptoms range from mild to severe. Some people with a severe COVID-19 infection develop severe disease. The virus that causes COVID-19 can spread from person to person through droplets or aerosols  from breathing, speaking, singing, coughing, or sneezing. Mild symptoms of COVID-19 can be managed at home with rest, fluids, and over-the-counter medicines. This information is not intended to replace advice given to you by your health care provider. Make sure you discuss any questions you have with your health care provider. Document  Revised: 05/03/2021 Document Reviewed: 05/05/2021 Elsevier Patient Education  [place].

## 2022-04-17 NOTE — Progress Notes (Signed)
Subjective   CC:  Chief Complaint  Patient presents with   Cough    Pt stated that he has been coughing since 04/13/2022. Has not been tested   Same day acute visit; PCP not available. New pt to me. Chart reviewed.   HPI: Phillip Perry is a 66 y.o. male who presents to the office today to address the problems listed above in the chief complaint. Patient complains of typical URI symptoms including nasal congestion, mild sore throat, cough, and mild malaise.  The symptoms have been present for 5 days; he is a well controlled type 2 diabetic. No sugar problems. Outside of chest congestion and mild cough, he is feeling well. Marland Kitchen He denies high fever or productive cough, shortness of breath or significant GI symptoms.  Over-the-counter cold medicines have been minimally or mildly helpful. Wife started with similar sxs today. No ear pain. No GI sxs. Sleeping well. Scheduled to receive 2nd shingrix vaccine tomorrow.   Assessment  1. COVID-19      Plan  covid, viral: discussed dx; no sign or sx of bacterial infection is present. Treat supportively with antihistamines, decongestants, and/or cough meds. See AVS for care instructions. No indication for antiviral at this time. Discussed prevention of spread.  Rec rescheduling shingrix vaccine.  Follow up: No follow-ups on file.   Orders Placed This Encounter  Procedures   POC COVID-19   No orders of the defined types were placed in this encounter.     I reviewed the patients updated PMH, FH, and SocHx.    Patient Active Problem List   Diagnosis Date Noted   Dyslipidemia 02/16/2022   Varicosities of leg 02/13/2022   Hyperglycemia 05/03/2020   Diplopia 01/31/2019   B12 deficiency 01/31/2019   Ataxia 12/21/2015   DYSPHAGIA 02/09/2009   PREMATURE VENTRICULAR CONTRACTIONS 12/25/2008   BARRETTS ESOPHAGUS 12/21/2008   Essential hypertension 06/19/2008   Allergic rhinitis 06/19/2008   GERD 06/19/2008   Paresthesia 06/19/2008   Current  Meds  Medication Sig   benazepril (LOTENSIN) 20 MG tablet TAKE 1 TABLET BY MOUTH DAILY   fluticasone (FLONASE) 50 MCG/ACT nasal spray Place 2 sprays into both nostrils daily.   loratadine (CLARITIN) 10 MG tablet Take 1 tablet (10 mg total) by mouth daily as needed. For allergies   metFORMIN (GLUCOPHAGE) 500 MG tablet TAKE ONE TABLET BY MOUTH DAILY   Multiple Vitamins-Minerals (ONE-A-DAY 50 PLUS PO) Take 1 tablet by mouth daily.    omeprazole (PRILOSEC) 20 MG capsule TAKE ONE CAPSULE BY MOUTH DAILY   vitamin B-12 (CYANOCOBALAMIN) 1000 MCG tablet Take 1 tablet (1,000 mcg total) by mouth daily.    Review of Systems: Constitutional: Negative for fever malaise or anorexia Cardiovascular: negative for chest pain Respiratory: negative for SOB or pleuritic chest pain Gastrointestinal: negative for abdominal pain  Objective  Vitals: BP 126/80   Pulse 81   Temp 97.9 F (36.6 C)   Ht '6\' 3"'$  (1.905 m)   Wt 251 lb 12.8 oz (114.2 kg)   SpO2 96%   BMI 31.47 kg/m  General: no acute respiratory distress , appears well Psych:  Alert and oriented, normal mood and affect HEENT: Normocephalic,  Cardiovascular:  RRR without murmur or gallop. Respiratory:  Good breath sounds bilaterally, CTAB with normal respiratory effort Skin:  Warm, no rashes Neurologic:   Mental status is normal.   Office Visit on 04/17/2022  Component Date Value Ref Range Status   SARS Coronavirus 2 Ag 04/17/2022 Positive (A)  Negative  Final    Commons side effects, risks, benefits, and alternatives for medications and treatment plan prescribed today were discussed, and the patient expressed understanding of the given instructions. Patient is instructed to call or message via MyChart if he/she has any questions or concerns regarding our treatment plan. No barriers to understanding were identified. We discussed Red Flag symptoms and signs in detail. Patient expressed understanding regarding what to do in case of urgent or  emergency type symptoms.  Medication list was reconciled, printed and provided to the patient in AVS. Patient instructions and summary information was reviewed with the patient as documented in the AVS. This note was prepared with assistance of Dragon voice recognition software. Occasional wrong-word or sound-a-like substitutions may have occurred due to the inherent limitations of voice recognition software

## 2022-04-18 ENCOUNTER — Ambulatory Visit: Payer: BC Managed Care – PPO

## 2022-05-10 ENCOUNTER — Other Ambulatory Visit: Payer: Self-pay | Admitting: Family Medicine

## 2022-05-17 ENCOUNTER — Ambulatory Visit (INDEPENDENT_AMBULATORY_CARE_PROVIDER_SITE_OTHER): Payer: BC Managed Care – PPO

## 2022-05-17 DIAGNOSIS — Z23 Encounter for immunization: Secondary | ICD-10-CM

## 2022-05-17 DIAGNOSIS — I10 Essential (primary) hypertension: Secondary | ICD-10-CM

## 2022-05-17 MED ORDER — BENAZEPRIL HCL 20 MG PO TABS: 20.0000 mg | ORAL_TABLET | Freq: Every day | ORAL | 1 refills | Status: DC

## 2022-05-17 NOTE — Progress Notes (Signed)
Pt here for 2nd Shingles Vaccine. Vaccine given in Left deltoid for Dr.Parker. Pt tolerated well.

## 2022-05-17 NOTE — Addendum Note (Signed)
Addended by: Verne Grain on: 05/17/2022 09:22 AM   Modules accepted: Orders

## 2022-06-13 ENCOUNTER — Encounter: Payer: Self-pay | Admitting: Gastroenterology

## 2022-06-26 ENCOUNTER — Ambulatory Visit: Payer: BC Managed Care – PPO | Admitting: Dermatology

## 2022-06-26 VITALS — BP 161/91 | HR 88

## 2022-06-26 DIAGNOSIS — L820 Inflamed seborrheic keratosis: Secondary | ICD-10-CM

## 2022-06-26 DIAGNOSIS — D1801 Hemangioma of skin and subcutaneous tissue: Secondary | ICD-10-CM

## 2022-06-26 DIAGNOSIS — L570 Actinic keratosis: Secondary | ICD-10-CM

## 2022-06-26 DIAGNOSIS — Z85828 Personal history of other malignant neoplasm of skin: Secondary | ICD-10-CM

## 2022-06-26 DIAGNOSIS — D229 Melanocytic nevi, unspecified: Secondary | ICD-10-CM

## 2022-06-26 DIAGNOSIS — L578 Other skin changes due to chronic exposure to nonionizing radiation: Secondary | ICD-10-CM | POA: Diagnosis not present

## 2022-06-26 DIAGNOSIS — L821 Other seborrheic keratosis: Secondary | ICD-10-CM

## 2022-06-26 DIAGNOSIS — L814 Other melanin hyperpigmentation: Secondary | ICD-10-CM | POA: Diagnosis not present

## 2022-06-26 DIAGNOSIS — Z1283 Encounter for screening for malignant neoplasm of skin: Secondary | ICD-10-CM | POA: Diagnosis not present

## 2022-06-26 NOTE — Progress Notes (Signed)
Follow-Up Visit   Subjective  Phillip Perry is a 67 y.o. male who presents for the following: Annual Exam (Hx BCC, Aks, ISKs). The patient presents for Total-Body Skin Exam (TBSE) for skin cancer screening and mole check.  The patient has spots, moles and lesions to be evaluated, some may be new or changing and the patient has concerns that these could be cancer.  The following portions of the chart were reviewed this encounter and updated as appropriate:   Tobacco  Allergies  Meds  Problems  Med Hx  Surg Hx  Fam Hx     Review of Systems:  No other skin or systemic complaints except as noted in HPI or Assessment and Plan.  Objective  Well appearing patient in no apparent distress; mood and affect are within normal limits.  A full examination was performed including scalp, head, eyes, ears, nose, lips, neck, chest, axillae, abdomen, back, buttocks, bilateral upper extremities, bilateral lower extremities, hands, feet, fingers, toes, fingernails, and toenails. All findings within normal limits unless otherwise noted below.  Hands and arms x 10, back x 10 (20) Erythematous stuck-on, waxy papule or plaque  Face and scalp x 9 (9) Erythematous thin papules/macules with gritty scale.    Assessment & Plan  Inflamed seborrheic keratosis (20) Hands and arms x 10, back x 10 Symptomatic, irritating, patient would like treated. Destruction of lesion - Hands and arms x 10, back x 10 Complexity: simple   Destruction method: cryotherapy   Informed consent: discussed and consent obtained   Timeout:  patient name, date of birth, surgical site, and procedure verified Lesion destroyed using liquid nitrogen: Yes   Region frozen until ice ball extended beyond lesion: Yes   Outcome: patient tolerated procedure well with no complications   Post-procedure details: wound care instructions given    AK (actinic keratosis) (9) Face and scalp x 9 Destruction of lesion - Face and scalp x  9 Complexity: simple   Destruction method: cryotherapy   Informed consent: discussed and consent obtained   Timeout:  patient name, date of birth, surgical site, and procedure verified Lesion destroyed using liquid nitrogen: Yes   Region frozen until ice ball extended beyond lesion: Yes   Outcome: patient tolerated procedure well with no complications   Post-procedure details: wound care instructions given    Lentigines - Scattered tan macules - Due to sun exposure - Benign-appearing, observe - Recommend daily broad spectrum sunscreen SPF 30+ to sun-exposed areas, reapply every 2 hours as needed. - Call for any changes  Seborrheic Keratoses - Stuck-on, waxy, tan-brown papules and/or plaques  - Benign-appearing - Discussed benign etiology and prognosis. - Observe - Call for any changes  Melanocytic Nevi - Tan-brown and/or pink-flesh-colored symmetric macules and papules - Benign appearing on exam today - Observation - Call clinic for new or changing moles - Recommend daily use of broad spectrum spf 30+ sunscreen to sun-exposed areas.   Hemangiomas - Red papules - Discussed benign nature - Observe - Call for any changes  Actinic Damage - Chronic condition, secondary to cumulative UV/sun exposure - diffuse scaly erythematous macules with underlying dyspigmentation - Recommend daily broad spectrum sunscreen SPF 30+ to sun-exposed areas, reapply every 2 hours as needed.  - Staying in the shade or wearing long sleeves, sun glasses (UVA+UVB protection) and wide brim hats (4-inch brim around the entire circumference of the hat) are also recommended for sun protection.  - Call for new or changing lesions.  History of Basal  Cell Carcinoma of the Skin - No evidence of recurrence today - Recommend regular full body skin exams - Recommend daily broad spectrum sunscreen SPF 30+ to sun-exposed areas, reapply every 2 hours as needed.  - Call if any new or changing lesions are noted  between office visits  Skin cancer screening performed today.  Return in about 1 year (around 06/27/2023) for TBSE.  Luther Redo, CMA, am acting as scribe for Sarina Ser, MD . Documentation: I have reviewed the above documentation for accuracy and completeness, and I agree with the above.  Sarina Ser, MD

## 2022-06-26 NOTE — Patient Instructions (Signed)
Due to recent changes in healthcare laws, you may see results of your pathology and/or laboratory studies on MyChart before the doctors have had a chance to review them. We understand that in some cases there may be results that are confusing or concerning to you. Please understand that not all results are received at the same time and often the doctors may need to interpret multiple results in order to provide you with the best plan of care or course of treatment. Therefore, we ask that you please give us 2 business days to thoroughly review all your results before contacting the office for clarification. Should we see a critical lab result, you will be contacted sooner.   If You Need Anything After Your Visit  If you have any questions or concerns for your doctor, please call our main line at [phone] and press option 4 to reach your doctor's medical assistant. If no one answers, please leave a voicemail as directed and we will return your call as soon as possible. Messages left after 4 pm will be answered the following business day.   You may also send us a message via MyChart. We typically respond to MyChart messages within 1-2 business days.  For prescription refills, please ask your pharmacy to contact our office. Our fax number is [phone].  If you have an urgent issue when the clinic is closed that cannot wait until the next business day, you can page your doctor at the number below.    Please note that while we do our best to be available for urgent issues outside of office hours, we are not available 24/7.   If you have an urgent issue and are unable to reach us, you may choose to seek medical care at your doctor's office, retail clinic, urgent care center, or emergency room.  If you have a medical emergency, please immediately call 911 or go to the emergency department.  Pager Numbers  - Dr. Kowalski: [phone]  - Dr. Moye: [phone]  - Dr. Stewart:  [phone]  In the event of inclement weather, please call our main line at [phone] for an update on the status of any delays or closures.  Dermatology Medication Tips: Please keep the boxes that topical medications come in in order to help keep track of the instructions about where and how to use these. Pharmacies typically print the medication instructions only on the boxes and not directly on the medication tubes.   If your medication is too expensive, please contact our office at [phone] option 4 or send us a message through MyChart.   We are unable to tell what your co-pay for medications will be in advance as this is different depending on your insurance coverage. However, we may be able to find a substitute medication at lower cost or fill out paperwork to get insurance to cover a needed medication.   If a prior authorization is required to get your medication covered by your insurance company, please allow us 1-2 business days to complete this process.  Drug prices often vary depending on where the prescription is filled and some pharmacies may offer cheaper prices.  The website www.goodrx.com contains coupons for medications through different pharmacies. The prices here do not account for what the cost may be with help from insurance (it may be cheaper with your insurance), but the website can give you the price if you did not use any insurance.  - You can print the associated coupon and take it with   your prescription to the pharmacy.  - You may also stop by our office during regular business hours and pick up a GoodRx coupon card.  - If you need your prescription sent electronically to a different pharmacy, notify our office through [facility] MyChart or by phone at [phone] option 4.     Si Usted Necesita Algo Despus de Su Visita  Tambin puede enviarnos un mensaje a travs de MyChart. Por lo general respondemos a los mensajes de MyChart en el transcurso de 1 a 2  das hbiles.  Para renovar recetas, por favor pida a su farmacia que se ponga en contacto con nuestra oficina. Nuestro nmero de fax es el [phone].  Si tiene un asunto urgente cuando la clnica est cerrada y que no puede esperar hasta el siguiente da hbil, puede llamar/localizar a su doctor(a) al nmero que aparece a continuacin.   Por favor, tenga en cuenta que aunque hacemos todo lo posible para estar disponibles para asuntos urgentes fuera del horario de oficina, no estamos disponibles las 24 horas del da, los 7 das de la semana.   Si tiene un problema urgente y no puede comunicarse con nosotros, puede optar por buscar atencin mdica  en el consultorio de su doctor(a), en una clnica privada, en un centro de atencin urgente o en una sala de emergencias.  Si tiene una emergencia mdica, por favor llame inmediatamente al 911 o vaya a la sala de emergencias.  Nmeros de bper  - Dr. Kowalski: [phone]  - Dra. Moye: [phone]  - Dra. Stewart: [phone]  En caso de inclemencias del tiempo, por favor llame a nuestra lnea principal al [phone] para una actualizacin sobre el estado de cualquier retraso o cierre.  Consejos para la medicacin en dermatologa: Por favor, guarde las cajas en las que vienen los medicamentos de uso tpico para ayudarle a seguir las instrucciones sobre dnde y cmo usarlos. Las farmacias generalmente imprimen las instrucciones del medicamento slo en las cajas y no directamente en los tubos del medicamento.   Si su medicamento es muy caro, por favor, pngase en contacto con nuestra oficina llamando al [phone] y presione la opcin 4 o envenos un mensaje a travs de MyChart.   No podemos decirle cul ser su copago por los medicamentos por adelantado ya que esto es diferente dependiendo de la cobertura de su seguro. Sin embargo, es posible que podamos encontrar un medicamento sustituto a menor costo o llenar un formulario para que el  seguro cubra el medicamento que se considera necesario.   Si se requiere una autorizacin previa para que su compaa de seguros cubra su medicamento, por favor permtanos de 1 a 2 das hbiles para completar este proceso.  Los precios de los medicamentos varan con frecuencia dependiendo del lugar de dnde se surte la receta y alguna farmacias pueden ofrecer precios ms baratos.  El sitio web www.goodrx.com tiene cupones para medicamentos de diferentes farmacias. Los precios aqu no tienen en cuenta lo que podra costar con la ayuda del seguro (puede ser ms barato con su seguro), pero el sitio web puede darle el precio si no utiliz ningn seguro.  - Puede imprimir el cupn correspondiente y llevarlo con su receta a la farmacia.  - Tambin puede pasar por nuestra oficina durante el horario de atencin regular y recoger una tarjeta de cupones de GoodRx.  - Si necesita que su receta se enve electrnicamente a una farmacia diferente, informe a nuestra oficina a travs de MyChart de [place]   o por telfono llamando al [phone] y presione la opcin 4.  

## 2022-06-30 ENCOUNTER — Encounter: Payer: Self-pay | Admitting: Dermatology

## 2022-08-14 ENCOUNTER — Other Ambulatory Visit: Payer: Self-pay | Admitting: Family Medicine

## 2022-08-14 NOTE — Telephone Encounter (Signed)
Patient states he has no more medication. Will need enough medication to last him until CPE in September.

## 2022-09-11 ENCOUNTER — Ambulatory Visit (AMBULATORY_SURGERY_CENTER): Payer: BC Managed Care – PPO | Admitting: *Deleted

## 2022-09-11 VITALS — Ht 75.0 in | Wt 250.0 lb

## 2022-09-11 DIAGNOSIS — Z8719 Personal history of other diseases of the digestive system: Secondary | ICD-10-CM

## 2022-09-11 NOTE — Progress Notes (Signed)

## 2022-09-27 ENCOUNTER — Encounter: Payer: BC Managed Care – PPO | Admitting: Gastroenterology

## 2022-09-28 ENCOUNTER — Encounter: Payer: Self-pay | Admitting: Gastroenterology

## 2022-10-06 ENCOUNTER — Encounter: Payer: Self-pay | Admitting: Gastroenterology

## 2022-10-06 ENCOUNTER — Ambulatory Visit (AMBULATORY_SURGERY_CENTER): Payer: BC Managed Care – PPO | Admitting: Gastroenterology

## 2022-10-06 VITALS — BP 102/78 | HR 74 | Temp 97.1°F | Resp 13 | Ht 75.0 in | Wt 250.0 lb

## 2022-10-06 DIAGNOSIS — K2950 Unspecified chronic gastritis without bleeding: Secondary | ICD-10-CM | POA: Diagnosis not present

## 2022-10-06 DIAGNOSIS — K2270 Barrett's esophagus without dysplasia: Secondary | ICD-10-CM

## 2022-10-06 DIAGNOSIS — K317 Polyp of stomach and duodenum: Secondary | ICD-10-CM | POA: Diagnosis not present

## 2022-10-06 DIAGNOSIS — Z8719 Personal history of other diseases of the digestive system: Secondary | ICD-10-CM

## 2022-10-06 MED ORDER — SODIUM CHLORIDE 0.9 % IV SOLN: 500.0000 mL | INTRAVENOUS | Status: DC

## 2022-10-06 NOTE — Patient Instructions (Addendum)
Recommendation:- Patient has a contact number available for                            emergencies. The signs and symptoms of potential                            delayed complications were discussed with the                            patient. Return to normal activities tomorrow.                            Written discharge instructions were provided to the                            patient.                           - Resume previous diet.                           - Follow antireflux measures.                           - Continue present medications.                           - Await pathology results.                           - Repeat upper endoscopy after studies are complete                            for surveillance of Barrett's esophagus.  Handout on GERD given.  YOU HAD AN ENDOSCOPIC PROCEDURE TODAY AT THE [facility]:   Refer to the procedure report that was given to you for any specific questions about what was found during the examination.  If the procedure report does not answer your questions, please call your gastroenterologist to clarify.  If you requested that your care partner not be given the details of your procedure findings, then the procedure report has been included in a sealed envelope for you to review at your convenience later.  YOU SHOULD EXPECT: Some feelings of bloating in the abdomen. Passage of more gas than usual.  Walking can help get rid of the air that was put into your GI tract during the procedure and reduce the bloating. If you had a lower endoscopy (such as a colonoscopy or flexible sigmoidoscopy) you may notice spotting of blood in your stool or on the toilet paper. If you underwent a bowel prep for your procedure, you may not have a normal bowel movement for a few days.  Please Note:  You might notice some irritation and congestion in your nose or some drainage.  This is from the oxygen used during your procedure.  There is no need for  concern and it should clear up in a day or so.  SYMPTOMS TO REPORT IMMEDIATELY: Following upper endoscopy (EGD)  Vomiting of blood or coffee ground material  New  chest pain or pain under the shoulder blades  Painful or persistently difficult swallowing  New shortness of breath  Fever of 100F or higher  Black, tarry-looking stools  For urgent or emergent issues, a gastroenterologist can be reached at any hour by calling [phone]. Do not use MyChart messaging for urgent concerns.   DIET:  We do recommend a small meal at first, but then you may proceed to your regular diet.  Drink plenty of fluids but you should avoid alcoholic beverages for 24 hours.  ACTIVITY:  You should plan to take it easy for the rest of today and you should NOT DRIVE or use heavy machinery until tomorrow (because of the sedation medicines used during the test).    FOLLOW UP: Our staff will call the number listed on your records the next business day following your procedure.  We will call around 7:15- 8:00 am to check on you and address any questions or concerns that you may have regarding the information given to you following your procedure. If we do not reach you, we will leave a message.     If any biopsies were taken you will be contacted by phone or by letter within the next 1-3 weeks.  Please call us at [phone] if you have not heard about the biopsies in 3 weeks.   SIGNATURES/CONFIDENTIALITY: You and/or your care partner have signed paperwork which will be entered into your electronic medical record.  These signatures attest to the fact that that the information above on your After Visit Summary has been reviewed and is understood.  Full responsibility of the confidentiality of this discharge information lies with you and/or your care-partner.
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
## 2022-10-06 NOTE — Progress Notes (Signed)
1102 Robinul 0.1 mg IV given due large amount of secretions upon assessment.  MD made aware, vss  

## 2022-10-06 NOTE — Progress Notes (Signed)
Report given to PACU, vss 

## 2022-10-06 NOTE — Op Note (Signed)
[facility] Patient Name: Phillip Perry Procedure Date: 10/06/2022 11:01 AM MRN: [id number] Endoscopist: Meryl Dare , MD, [phone] Age: 67 Referring MD:  Date of Birth: 1955/12/30 Gender: Male Account #: [id number] Procedure:                Upper GI endoscopy Indications:              Surveillance for malignancy due to personal history                            of Barrett's esophagus Medicines:                Monitored Anesthesia Care Procedure:                Pre-Anesthesia Assessment:                           - Prior to the procedure, a History and Physical                            was performed, and patient medications and                            allergies were reviewed. The patient's tolerance of                            previous anesthesia was also reviewed. The risks                            and benefits of the procedure and the sedation                            options and risks were discussed with the patient.                            All questions were answered, and informed consent                            was obtained. Prior Anticoagulants: The patient has                            taken no anticoagulant or antiplatelet agents. ASA                            Grade Assessment: II - A patient with mild systemic                            disease. After reviewing the risks and benefits,                            the patient was deemed in satisfactory condition to                            undergo the procedure.  After obtaining informed consent, the endoscope was                            passed under direct vision. Throughout the                            procedure, the patient's blood pressure, pulse, and                            oxygen saturations were monitored continuously. The                            GIF [id number] #[id number] was introduced through the                            mouth, and advanced to the second  part of duodenum.                            The upper GI endoscopy was accomplished without                            difficulty. The patient tolerated the procedure                            well. Scope In: Scope Out: Findings:                 There were esophageal mucosal changes secondary to                            established short-segment Barrett's disease present                            in the distal esophagus. The maximum longitudinal                            extent of these mucosal changes was 2 cm in length.                            Mucosa was biopsied with a cold forceps for                            histology. One specimen bottle was sent to                            pathology.                           The exam of the esophagus was otherwise normal.                           A few small sessile polyps with no bleeding and no                            stigmata of  recent bleeding were found in the                            gastric fundus.                           A medium-sized hiatal hernia was present.                           The exam of the stomach was otherwise normal.                           The duodenal bulb and second portion of the                            duodenum were normal. Complications:            No immediate complications. Estimated Blood Loss:     Estimated blood loss was minimal. Impression:               - Esophageal mucosal changes secondary to                            established short-segment Barrett's disease.                            Biopsied.                           - A few gastric polyps.                           - Medium-sized hiatal hernia.                           - Normal duodenal bulb and second portion of the                            duodenum. Recommendation:           - Patient has a contact number available for                            emergencies. The signs and symptoms of potential                             delayed complications were discussed with the                            patient. Return to normal activities tomorrow.                            Written discharge instructions were provided to the                            patient.                           -  Resume previous diet.                           - Follow antireflux measures.                           - Continue present medications.                           - Await pathology results.                           - Repeat upper endoscopy after studies are complete                            for surveillance of Barrett's esophagus. Meryl Dare, MD 10/06/2022 11:25:27 AM This report has been signed electronically.

## 2022-10-09 ENCOUNTER — Telehealth: Payer: Self-pay | Admitting: *Deleted

## 2022-10-09 NOTE — Telephone Encounter (Signed)
  Follow up Call-     10/06/2022   10:38 AM  Call back number  Post procedure Call Back phone  # [phone]  Permission to leave phone message Yes     Patient questions:  Do you have a fever, pain , or abdominal swelling? No. Pain Score  0 *  Have you tolerated food without any problems? Yes.    Have you been able to return to your normal activities? Yes.    Do you have any questions about your discharge instructions: Diet   No. Medications  No. Follow up visit  No.  Do you have questions or concerns about your Care? No.  Actions: * If pain score is 4 or above: No action needed, pain <4.

## 2022-10-24 ENCOUNTER — Encounter: Payer: Self-pay | Admitting: Gastroenterology

## 2022-12-06 ENCOUNTER — Other Ambulatory Visit: Payer: Self-pay | Admitting: Family Medicine

## 2022-12-06 DIAGNOSIS — I10 Essential (primary) hypertension: Secondary | ICD-10-CM

## 2023-01-11 ENCOUNTER — Encounter (INDEPENDENT_AMBULATORY_CARE_PROVIDER_SITE_OTHER): Payer: Self-pay

## 2023-02-01 ENCOUNTER — Other Ambulatory Visit: Payer: Self-pay | Admitting: *Deleted

## 2023-02-01 ENCOUNTER — Telehealth: Payer: Self-pay | Admitting: Family Medicine

## 2023-02-01 DIAGNOSIS — I10 Essential (primary) hypertension: Secondary | ICD-10-CM

## 2023-02-01 MED ORDER — OMEPRAZOLE 20 MG PO CPDR: 20.0000 mg | DELAYED_RELEASE_CAPSULE | Freq: Every day | ORAL | 1 refills | Status: DC

## 2023-02-01 MED ORDER — BENAZEPRIL HCL 20 MG PO TABS
20.0000 mg | ORAL_TABLET | Freq: Every day | ORAL | 1 refills | Status: DC
Start: 2023-02-01 — End: 2023-04-02

## 2023-02-01 NOTE — Telephone Encounter (Signed)
Prescription Request  02/01/2023  LOV: 02/13/2022  What is the name of the medication or equipment?   benazepril (LOTENSIN) 20 MG tablet  AND  omeprazole (PRILOSEC) 20 MG capsule   Have you contacted your pharmacy to request a refill? Yes   Which pharmacy would you like this sent to?   [facility] [id number] - [place], [place] - [address] [place] [address] Phone: [phone] Fax: [phone]    Patient notified that their request is being sent to the clinical staff for review and that they should receive a response within 2 business days.   Please advise at Mobile [phone] (mobile)

## 2023-02-01 NOTE — Telephone Encounter (Signed)
Prescription send to [facility] pharmacy

## 2023-02-03 ENCOUNTER — Other Ambulatory Visit: Payer: Self-pay | Admitting: Family Medicine

## 2023-02-03 DIAGNOSIS — I10 Essential (primary) hypertension: Secondary | ICD-10-CM

## 2023-02-19 ENCOUNTER — Encounter: Payer: Self-pay | Admitting: Neurology

## 2023-02-19 ENCOUNTER — Ambulatory Visit (INDEPENDENT_AMBULATORY_CARE_PROVIDER_SITE_OTHER): Payer: BC Managed Care – PPO | Admitting: Family Medicine

## 2023-02-19 ENCOUNTER — Encounter: Payer: Self-pay | Admitting: Family Medicine

## 2023-02-19 VITALS — BP 130/85 | HR 83 | Temp 97.1°F | Ht 75.0 in | Wt 253.4 lb

## 2023-02-19 DIAGNOSIS — Z23 Encounter for immunization: Secondary | ICD-10-CM

## 2023-02-19 DIAGNOSIS — Z0001 Encounter for general adult medical examination with abnormal findings: Secondary | ICD-10-CM

## 2023-02-19 DIAGNOSIS — E538 Deficiency of other specified B group vitamins: Secondary | ICD-10-CM | POA: Diagnosis not present

## 2023-02-19 DIAGNOSIS — Z0000 Encounter for general adult medical examination without abnormal findings: Secondary | ICD-10-CM

## 2023-02-19 DIAGNOSIS — R739 Hyperglycemia, unspecified: Secondary | ICD-10-CM | POA: Diagnosis not present

## 2023-02-19 DIAGNOSIS — H532 Diplopia: Secondary | ICD-10-CM

## 2023-02-19 DIAGNOSIS — E785 Hyperlipidemia, unspecified: Secondary | ICD-10-CM | POA: Diagnosis not present

## 2023-02-19 DIAGNOSIS — R202 Paresthesia of skin: Secondary | ICD-10-CM

## 2023-02-19 DIAGNOSIS — M25511 Pain in right shoulder: Secondary | ICD-10-CM

## 2023-02-19 DIAGNOSIS — I10 Essential (primary) hypertension: Secondary | ICD-10-CM

## 2023-02-19 DIAGNOSIS — R270 Ataxia, unspecified: Secondary | ICD-10-CM | POA: Diagnosis not present

## 2023-02-19 LAB — LIPID PANEL
Cholesterol: 176 mg/dL (ref 0–200)
HDL: 32.8 mg/dL — ABNORMAL LOW (ref >39.00)
LDL Cholesterol: 82 mg/dL (ref 0–99)
NonHDL: 143.07
Total CHOL/HDL Ratio: 5
Triglycerides: 307 mg/dL — ABNORMAL HIGH (ref 0.0–149.0)
VLDL: 61.4 mg/dL — ABNORMAL HIGH (ref 0.0–40.0)

## 2023-02-19 LAB — HEMOGLOBIN A1C: Hgb A1c MFr Bld: 6.4 % (ref 4.6–6.5)

## 2023-02-19 LAB — COMPREHENSIVE METABOLIC PANEL
ALT: 32 U/L (ref 0–53)
AST: 35 U/L (ref 0–37)
Albumin: 4.6 g/dL (ref 3.5–5.2)
Alkaline Phosphatase: 60 U/L (ref 39–117)
BUN: 15 mg/dL (ref 6–23)
CO2: 26 mEq/L (ref 19–32)
Calcium: 9.6 mg/dL (ref 8.4–10.5)
Chloride: 101 mEq/L (ref 96–112)
Creatinine, Ser: 1.05 mg/dL (ref 0.40–1.50)
GFR: 74.59 mL/min (ref >60.00)
Glucose, Bld: 148 mg/dL — ABNORMAL HIGH (ref 70–99)
Potassium: 4.2 mEq/L (ref 3.5–5.1)
Sodium: 138 mEq/L (ref 135–145)
Total Bilirubin: 0.7 mg/dL (ref 0.2–1.2)
Total Protein: 7.1 g/dL (ref 6.0–8.3)

## 2023-02-19 LAB — CBC
HCT: 45.8 % (ref 39.0–52.0)
Hemoglobin: 15.7 g/dL (ref 13.0–17.0)
MCHC: 34.2 g/dL (ref 30.0–36.0)
MCV: 91 fl (ref 78.0–100.0)
Platelets: 215 10*3/uL (ref 150.0–400.0)
RBC: 5.03 Mil/uL (ref 4.22–5.81)
RDW: 14.1 % (ref 11.5–15.5)
WBC: 6.4 10*3/uL (ref 4.0–10.5)

## 2023-02-19 LAB — PSA: PSA: 0.85 ng/mL (ref 0.10–4.00)

## 2023-02-19 LAB — TSH: TSH: 2.74 u[IU]/mL (ref 0.35–5.50)

## 2023-02-19 LAB — VITAMIN B12: Vitamin B-12: 941 pg/mL — ABNORMAL HIGH (ref 211–911)

## 2023-02-19 NOTE — Assessment & Plan Note (Signed)
Check A1c.  He is on metformin 500 mg daily.  We discussed lifestyle modifications.

## 2023-02-19 NOTE — Assessment & Plan Note (Signed)
On 1000 mcg daily.  Check B12.

## 2023-02-19 NOTE — Addendum Note (Signed)
Addended by: Dyann Kief on: 02/19/2023 09:49 AM   Modules accepted: Orders
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
## 2023-02-19 NOTE — Assessment & Plan Note (Signed)
Worsened since our last visit.  He has followed with neurology but has not seen them for the last few years.  Having more neuropathic symptoms in the lower extremities bilaterally.  Will be checking labs today though advised him to follow-up with neurology soon for further evaluation.  Will place referral today.

## 2023-02-19 NOTE — Patient Instructions (Signed)
It was very nice to see you today!  We will check blood work today.  It is important for you to follow back up with neurology.  We will refer you over for this today.  Will give you your pneumonia and tetanus vaccine today.  Let us know if you change your mind about referral to see sports medicine or physical therapy for your neck and shoulder.   Please continue to work on diet and exercise.  Return in about 1 year (around 02/19/2024) for Annual Physical.   Take care, Dr Jimmey Ralph  PLEASE NOTE:  If you had any lab tests, please let us know if you have not heard back within a few days. You may see your results on mychart before we have a chance to review them but we will give you a call once they are reviewed by [name].   If we ordered any referrals today, please let us know if you have not heard from their office within the next week.   If you had any urgent prescriptions sent in today, please check with the pharmacy within an hour of our visit to make sure the prescription was transmitted appropriately.   Please try these tips to maintain a healthy lifestyle:  Eat at least 3 REAL meals and 1-2 snacks per day.  Aim for no more than 5 hours between eating.  If you eat breakfast, please do so within one hour of getting up.   Each meal should contain half fruits/vegetables, one quarter protein, and one quarter carbs (no bigger than a computer mouse)  Cut down on sweet beverages. This includes juice, soda, and sweet tea.   Drink at least 1 glass of water with each meal and aim for at least 8 glasses per day  Exercise at least 150 minutes every week.    Preventive Care 72 Years and Older, Male Preventive care refers to lifestyle choices and visits with your health care provider that can promote health and wellness. Preventive care visits are also called wellness exams. What can I expect for my preventive care visit? Counseling During your preventive care visit, your health care provider  may ask about your: Medical history, including: Past medical problems. Family medical history. History of falls. Current health, including: Emotional well-being. Home life and relationship well-being. Sexual activity. Memory and ability to understand (cognition). Lifestyle, including: Alcohol, nicotine or tobacco, and drug use. Access to firearms. Diet, exercise, and sleep habits. Work and work Astronomer. Sunscreen use. Safety issues such as seatbelt and bike helmet use. Physical exam Your health care provider will check your: Height and weight. These may be used to calculate your BMI (body mass index). BMI is a measurement that tells if you are at a healthy weight. Waist circumference. This measures the distance around your waistline. This measurement also tells if you are at a healthy weight and may help predict your risk of certain diseases, such as type 2 diabetes and high blood pressure. Heart rate and blood pressure. Body temperature. Skin for abnormal spots. What immunizations do I need?  Vaccines are usually given at various ages, according to a schedule. Your health care provider will recommend vaccines for you based on your age, medical history, and lifestyle or other factors, such as travel or where you work. What tests do I need? Screening Your health care provider may recommend screening tests for certain conditions. This may include: Lipid and cholesterol levels. Diabetes screening. This is done by checking your blood sugar (glucose) after  you have not eaten for a while (fasting). Hepatitis C test. Hepatitis B test. HIV (human immunodeficiency virus) test. STI (sexually transmitted infection) testing, if you are at risk. Lung cancer screening. Colorectal cancer screening. Prostate cancer screening. Abdominal aortic aneurysm (AAA) screening. You may need this if you are a current or former smoker. Talk with your health care provider about your test results,  treatment options, and if necessary, the need for more tests. Follow these instructions at home: Eating and drinking  Eat a diet that includes fresh fruits and vegetables, whole grains, lean protein, and low-fat dairy products. Limit your intake of foods with high amounts of sugar, saturated fats, and salt. Take vitamin and mineral supplements as recommended by your health care provider. Do not drink alcohol if your health care provider tells you not to drink. If you drink alcohol: Limit how much you have to 0-2 drinks a day. Know how much alcohol is in your drink. In the [place] one drink equals one 12 oz bottle of beer (355 mL), one 5 oz glass of wine (148 mL), or one 1 oz glass of hard liquor (44 mL). Lifestyle Brush your teeth every morning and night with fluoride toothpaste. Floss one time each day. Exercise for at least 30 minutes 5 or more days each week. Do not use any products that contain nicotine or tobacco. These products include cigarettes, chewing tobacco, and vaping devices, such as e-cigarettes. If you need help quitting, ask your health care provider. Do not use drugs. If you are sexually active, practice safe sex. Use a condom or other form of protection to prevent STIs. Take aspirin only as told by your health care provider. Make sure that you understand how much to take and what form to take. Work with your health care provider to find out whether it is safe and beneficial for you to take aspirin daily. Ask your health care provider if you need to take a cholesterol-lowering medicine (statin). Find healthy ways to manage stress, such as: Meditation, yoga, or listening to music. Journaling. Talking to a trusted person. Spending time with friends and family. Safety Always wear your seat belt while driving or riding in a vehicle. Do not drive: If you have been drinking alcohol. Do not ride with someone who has been drinking. When you are tired or distracted. While  texting. If you have been using any mind-altering substances or drugs. Wear a helmet and other protective equipment during sports activities. If you have firearms in your house, make sure you follow all gun safety procedures. Minimize exposure to UV radiation to reduce your risk of skin cancer. What's next? Visit your health care provider once a year for an annual wellness visit. Ask your health care provider how often you should have your eyes and teeth checked. Stay up to date on all vaccines. This information is not intended to replace advice given to you by your health care provider. Make sure you discuss any questions you have with your health care provider. Document Revised: 11/10/2020 Document Reviewed: 11/10/2020 Elsevier Patient Education  2024 [employer].

## 2023-02-19 NOTE — Assessment & Plan Note (Signed)
Also worsened since our last visit.  Symptoms seem to be more pronounced later in the evening and he has had myasthenia workup in the past which was negative.  Not currently having any symptoms but needs to be followed by neurology closely.  Will be referring as above.

## 2023-02-19 NOTE — Assessment & Plan Note (Signed)
Worsening since our last visit as well.  He has had a few falls due to imbalance.  Discussed importance of him seeing a neurologist.  He was initially apprehensive about going back however is agreeable.  Will place referral today.

## 2023-02-19 NOTE — Assessment & Plan Note (Signed)
Blood pressure at all today on benazepril 20 mg daily.

## 2023-02-19 NOTE — Assessment & Plan Note (Signed)
Check lipids

## 2023-02-22 NOTE — Progress Notes (Signed)
His cholesterol is better than last year.  His blood sugar is a little bit worse but in the borderline range.  Do not need to start meds for either of these but he should continue to work on diet and exercise and we can recheck these in a year or so.  The rest of his labs are all stable.  Recommend he follow-up with neurology soon as we discussed at his office visit.

## 2023-03-31 ENCOUNTER — Other Ambulatory Visit: Payer: Self-pay | Admitting: Family Medicine

## 2023-03-31 DIAGNOSIS — I10 Essential (primary) hypertension: Secondary | ICD-10-CM

## 2023-05-09 ENCOUNTER — Other Ambulatory Visit: Payer: Self-pay | Admitting: Family Medicine

## 2023-06-04 ENCOUNTER — Other Ambulatory Visit: Payer: Self-pay | Admitting: Family Medicine

## 2023-06-04 DIAGNOSIS — I10 Essential (primary) hypertension: Secondary | ICD-10-CM

## 2023-06-12 ENCOUNTER — Ambulatory Visit: Payer: BC Managed Care – PPO | Admitting: Neurology
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
## 2023-06-28 ENCOUNTER — Ambulatory Visit: Payer: BC Managed Care – PPO | Admitting: Dermatology

## 2023-06-28 ENCOUNTER — Ambulatory Visit: Payer: BC Managed Care – PPO | Admitting: Neurology

## 2023-06-28 ENCOUNTER — Encounter: Payer: Self-pay | Admitting: Neurology

## 2023-06-28 VITALS — BP 130/80 | HR 81 | Ht 75.0 in | Wt 255.0 lb

## 2023-06-28 DIAGNOSIS — R270 Ataxia, unspecified: Secondary | ICD-10-CM | POA: Diagnosis not present

## 2023-06-28 DIAGNOSIS — G629 Polyneuropathy, unspecified: Secondary | ICD-10-CM | POA: Diagnosis not present

## 2023-06-28 DIAGNOSIS — H532 Diplopia: Secondary | ICD-10-CM

## 2023-06-28 NOTE — Patient Instructions (Signed)
It was good to see you.  You can let me know if you decide to proceed with testing in the future.  The physicians and staff at [facility] are committed to providing excellent care. You may receive a survey requesting feedback about your experience at our office. We strive to receive "very good" responses to the survey questions. If you feel that your experience would prevent you from giving the office a "very good " response, please contact our office to try to remedy the situation. We may be reached at [phone]. Thank you for taking the time out of your busy day to complete the survey.

## 2023-08-02 ENCOUNTER — Other Ambulatory Visit: Payer: Self-pay | Admitting: Family Medicine

## 2023-08-02 DIAGNOSIS — I10 Essential (primary) hypertension: Secondary | ICD-10-CM

## 2023-09-11 DIAGNOSIS — L814 Other melanin hyperpigmentation: Secondary | ICD-10-CM | POA: Diagnosis not present

## 2023-09-11 DIAGNOSIS — L570 Actinic keratosis: Secondary | ICD-10-CM | POA: Diagnosis not present

## 2023-09-11 DIAGNOSIS — Z08 Encounter for follow-up examination after completed treatment for malignant neoplasm: Secondary | ICD-10-CM | POA: Diagnosis not present

## 2023-09-11 DIAGNOSIS — L821 Other seborrheic keratosis: Secondary | ICD-10-CM | POA: Diagnosis not present

## 2023-09-11 DIAGNOSIS — D225 Melanocytic nevi of trunk: Secondary | ICD-10-CM | POA: Diagnosis not present

## 2024-01-24 ENCOUNTER — Other Ambulatory Visit: Payer: Self-pay | Admitting: Family Medicine

## 2024-01-24 DIAGNOSIS — I10 Essential (primary) hypertension: Secondary | ICD-10-CM

## 2024-01-24 MED ORDER — OMEPRAZOLE 20 MG PO CPDR
20.0000 mg | DELAYED_RELEASE_CAPSULE | Freq: Every day | ORAL | 1 refills | Status: DC
Start: 2024-01-24 — End: 2024-02-25

## 2024-01-24 MED ORDER — BENAZEPRIL HCL 20 MG PO TABS
20.0000 mg | ORAL_TABLET | Freq: Every day | ORAL | 2 refills | Status: DC
Start: 2024-01-24 — End: 2024-02-25

## 2024-01-24 NOTE — Telephone Encounter (Signed)
 Copied from CRM #[id number]. Topic: Clinical - Medication Refill >> Jan 24, 2024  9:51 AM Macario HERO wrote: Medication: omeprazole  (PRILOSEC ) 20 MG capsule [[id number]] benazepril  (LOTENSIN ) 20 MG tablet [[id number]]  Has the patient contacted their pharmacy? No (Agent: If no, request that the patient contact the pharmacy for the refill. If patient does not wish to contact the pharmacy document the reason why and proceed with request.) (Agent: If yes, when and what did the pharmacy advise?)  This is the patient's preferred pharmacy:  [facility] [id number] - [place], [place] - [address] [place] [address] Phone: [phone] Fax: [phone]  Is this the correct pharmacy for this prescription? Yes If no, delete pharmacy and type the correct one.   Has the prescription been filled recently? No  Is the patient out of the medication? Yes  Has the patient been seen for an appointment in the last year OR does the patient have an upcoming appointment? Yes  Can we respond through MyChart? Yes  Agent: Please be advised that Rx refills may take up to 3 business days. We ask that you follow-up with your pharmacy.

## 2024-02-25 ENCOUNTER — Encounter: Payer: Self-pay | Admitting: Family Medicine

## 2024-02-25 ENCOUNTER — Ambulatory Visit: Payer: BC Managed Care – PPO | Admitting: Family Medicine

## 2024-02-25 VITALS — BP 128/84 | HR 79 | Temp 97.2°F | Ht 75.0 in | Wt 257.0 lb

## 2024-02-25 DIAGNOSIS — R202 Paresthesia of skin: Secondary | ICD-10-CM

## 2024-02-25 DIAGNOSIS — Z0001 Encounter for general adult medical examination with abnormal findings: Secondary | ICD-10-CM | POA: Diagnosis not present

## 2024-02-25 DIAGNOSIS — Z131 Encounter for screening for diabetes mellitus: Secondary | ICD-10-CM | POA: Diagnosis not present

## 2024-02-25 DIAGNOSIS — E538 Deficiency of other specified B group vitamins: Secondary | ICD-10-CM | POA: Diagnosis not present

## 2024-02-25 DIAGNOSIS — R270 Ataxia, unspecified: Secondary | ICD-10-CM

## 2024-02-25 DIAGNOSIS — H532 Diplopia: Secondary | ICD-10-CM

## 2024-02-25 DIAGNOSIS — R739 Hyperglycemia, unspecified: Secondary | ICD-10-CM

## 2024-02-25 DIAGNOSIS — K219 Gastro-esophageal reflux disease without esophagitis: Secondary | ICD-10-CM

## 2024-02-25 DIAGNOSIS — I10 Essential (primary) hypertension: Secondary | ICD-10-CM | POA: Diagnosis not present

## 2024-02-25 DIAGNOSIS — Z23 Encounter for immunization: Secondary | ICD-10-CM

## 2024-02-25 DIAGNOSIS — Z125 Encounter for screening for malignant neoplasm of prostate: Secondary | ICD-10-CM

## 2024-02-25 DIAGNOSIS — E785 Hyperlipidemia, unspecified: Secondary | ICD-10-CM | POA: Diagnosis not present

## 2024-02-25 LAB — CBC
HCT: 47.7 % (ref 39.0–52.0)
Hemoglobin: 16.1 g/dL (ref 13.0–17.0)
MCHC: 33.8 g/dL (ref 30.0–36.0)
MCV: 91 fl (ref 78.0–100.0)
Platelets: 208 K/uL (ref 150.0–400.0)
RBC: 5.24 Mil/uL (ref 4.22–5.81)
RDW: 14.2 % (ref 11.5–15.5)
WBC: 6.2 K/uL (ref 4.0–10.5)

## 2024-02-25 LAB — COMPREHENSIVE METABOLIC PANEL WITH GFR
ALT: 33 U/L (ref 0–53)
AST: 30 U/L (ref 0–37)
Albumin: 4.9 g/dL (ref 3.5–5.2)
Alkaline Phosphatase: 69 U/L (ref 39–117)
BUN: 17 mg/dL (ref 6–23)
CO2: 29 meq/L (ref 19–32)
Calcium: 9.5 mg/dL (ref 8.4–10.5)
Chloride: 100 meq/L (ref 96–112)
Creatinine, Ser: 1.11 mg/dL (ref 0.40–1.50)
GFR: 69.28 mL/min (ref >60.00)
Glucose, Bld: 157 mg/dL — ABNORMAL HIGH (ref 70–99)
Potassium: 4.3 meq/L (ref 3.5–5.1)
Sodium: 137 meq/L (ref 135–145)
Total Bilirubin: 0.6 mg/dL (ref 0.2–1.2)
Total Protein: 7.4 g/dL (ref 6.0–8.3)

## 2024-02-25 LAB — VITAMIN B12: Vitamin B-12: 643 pg/mL (ref 211–911)

## 2024-02-25 LAB — LIPID PANEL
Cholesterol: 193 mg/dL (ref 0–200)
HDL: 31.7 mg/dL — ABNORMAL LOW (ref >39.00)
LDL Cholesterol: 96 mg/dL (ref 0–99)
NonHDL: 161.52
Total CHOL/HDL Ratio: 6
Triglycerides: 326 mg/dL — ABNORMAL HIGH (ref 0.0–149.0)
VLDL: 65.2 mg/dL — ABNORMAL HIGH (ref 0.0–40.0)

## 2024-02-25 LAB — TSH: TSH: 3.49 u[IU]/mL (ref 0.35–5.50)

## 2024-02-25 LAB — HEMOGLOBIN A1C: Hgb A1c MFr Bld: 7.3 % — ABNORMAL HIGH (ref 4.6–6.5)

## 2024-02-25 LAB — PSA: PSA: 1.02 ng/mL (ref 0.10–4.00)

## 2024-02-25 MED ORDER — OMEPRAZOLE 20 MG PO CPDR: 20.0000 mg | DELAYED_RELEASE_CAPSULE | Freq: Every day | ORAL | 3 refills | Status: AC

## 2024-02-25 MED ORDER — METFORMIN HCL 500 MG PO TABS: 500.0000 mg | ORAL_TABLET | Freq: Every day | ORAL | 3 refills | Status: AC

## 2024-02-25 MED ORDER — BENAZEPRIL HCL 20 MG PO TABS: 20.0000 mg | ORAL_TABLET | Freq: Every day | ORAL | 2 refills | Status: AC

## 2024-02-25 NOTE — Assessment & Plan Note (Signed)
 Check lipids

## 2024-02-25 NOTE — Assessment & Plan Note (Signed)
 Check A1c.

## 2024-02-25 NOTE — Assessment & Plan Note (Signed)
Stable on omeprazole 20mg daily.  

## 2024-02-25 NOTE — Assessment & Plan Note (Signed)
 Saw neurology for this several months ago.  They did discuss further evaluation however he declined for now.  Likely contributed to his above fall.

## 2024-02-25 NOTE — Addendum Note (Signed)
 Addended by: IDA ELORA HERO on: 02/25/2024 09:04 AM   Modules accepted: Orders

## 2024-02-25 NOTE — Patient Instructions (Signed)
 It was very nice to see you today!  VISIT SUMMARY: Today, you had your annual physical exam. We discussed your fall in August, your ongoing neuropathy, double vision, hypertension management, and vitamin B12 supplementation.  YOUR PLAN: CONCUSSION, STATUS POST FALL AUGUST : You experienced a probable concussion after a fall in August, but your symptoms have resolved. -We performed a thorough neurological exam today.  PERIPHERAL NEUROPATHY: You have chronic peripheral neuropathy causing numbness and balance issues. -Continue home exercises for balance improvement. -Consider physical therapy if symptoms worsen or for fall prevention.  DOUBLE VISION (DIPLOPIA): You have chronic slight double vision, which is stable. -Consider scheduling an eye exam since your last one was a couple of years ago.  HYPERTENSION: We discussed ongoing management of your hypertension, focusing on medication adherence and refill issues. -We refilled all your current hypertension medications. -We will coordinate with the pharmacy to synchronize your medication refills.  VITAMIN B12 SUPPLEMENTATION, MONITORING: Your previous B12 levels were adequate, suggesting current supplementation may be unnecessary. -We will check your B12 levels with today's blood work. -If your B12 levels are high, we will discontinue supplementation.  Return in about 1 year (around 02/24/2025) for Annual Physical.   Take care, Dr Kennyth  PLEASE NOTE:  If you had any lab tests, please let us  know if you have not heard back within a few days. You may see your results on mychart before we have a chance to review them but we will give you a call once they are reviewed by us .   If we ordered any referrals today, please let us  know if you have not heard from their office within the next week.   If you had any urgent prescriptions sent in today, please check with the pharmacy within an hour of our visit to make sure the prescription was  transmitted appropriately.   Please try these tips to maintain a healthy lifestyle:  Eat at least 3 REAL meals and 1-2 snacks per day.  Aim for no more than 5 hours between eating.  If you eat breakfast, please do so within one hour of getting up.   Each meal should contain half fruits/vegetables, one quarter protein, and one quarter carbs (no bigger than a computer mouse)  Cut down on sweet beverages. This includes juice, soda, and sweet tea.   Drink at least 1 glass of water with each meal and aim for at least 8 glasses per day  Exercise at least 150 minutes every week.    Preventive Care 83 Years and Older, Male Preventive care refers to lifestyle choices and visits with your health care provider that can promote health and wellness. Preventive care visits are also called wellness exams. What can I expect for my preventive care visit? Counseling During your preventive care visit, your health care provider may ask about your: Medical history, including: Past medical problems. Family medical history. History of falls. Current health, including: Emotional well-being. Home life and relationship well-being. Sexual activity. Memory and ability to understand (cognition). Lifestyle, including: Alcohol, nicotine or tobacco, and drug use. Access to firearms. Diet, exercise, and sleep habits. Work and work Astronomer. Sunscreen use. Safety issues such as seatbelt and bike helmet use. Physical exam Your health care provider will check your: Height and weight. These may be used to calculate your BMI (body mass index). BMI is a measurement that tells if you are at a healthy weight. Waist circumference. This measures the distance around your waistline. This measurement also tells if  you are at a healthy weight and may help predict your risk of certain diseases, such as type 2 diabetes and high blood pressure. Heart rate and blood pressure. Body temperature. Skin for abnormal  spots. What immunizations do I need?  Vaccines are usually given at various ages, according to a schedule. Your health care provider will recommend vaccines for you based on your age, medical history, and lifestyle or other factors, such as travel or where you work. What tests do I need? Screening Your health care provider may recommend screening tests for certain conditions. This may include: Lipid and cholesterol levels. Diabetes screening. This is done by checking your blood sugar (glucose) after you have not eaten for a while (fasting). Hepatitis C test. Hepatitis B test. HIV (human immunodeficiency virus) test. STI (sexually transmitted infection) testing, if you are at risk. Lung cancer screening. Colorectal cancer screening. Prostate cancer screening. Abdominal aortic aneurysm (AAA) screening. You may need this if you are a current or former smoker. Talk with your health care provider about your test results, treatment options, and if necessary, the need for more tests. Follow these instructions at home: Eating and drinking  Eat a diet that includes fresh fruits and vegetables, whole grains, lean protein, and low-fat dairy products. Limit your intake of foods with high amounts of sugar, saturated fats, and salt. Take vitamin and mineral supplements as recommended by your health care provider. Do not drink alcohol if your health care provider tells you not to drink. If you drink alcohol: Limit how much you have to 0-2 drinks a day. Know how much alcohol is in your drink. In the [place] one drink equals one 12 oz bottle of beer (355 mL), one 5 oz glass of wine (148 mL), or one 1 oz glass of hard liquor (44 mL). Lifestyle Brush your teeth every morning and night with fluoride toothpaste. Floss one time each day. Exercise for at least 30 minutes 5 or more days each week. Do not use any products that contain nicotine or tobacco. These products include cigarettes, chewing tobacco, and  vaping devices, such as e-cigarettes. If you need help quitting, ask your health care provider. Do not use drugs. If you are sexually active, practice safe sex. Use a condom or other form of protection to prevent STIs. Take aspirin only as told by your health care provider. Make sure that you understand how much to take and what form to take. Work with your health care provider to find out whether it is safe and beneficial for you to take aspirin daily. Ask your health care provider if you need to take a cholesterol-lowering medicine (statin). Find healthy ways to manage stress, such as: Meditation, yoga, or listening to music. Journaling. Talking to a trusted person. Spending time with friends and family. Safety Always wear your seat belt while driving or riding in a vehicle. Do not drive: If you have been drinking alcohol. Do not ride with someone who has been drinking. When you are tired or distracted. While texting. If you have been using any mind-altering substances or drugs. Wear a helmet and other protective equipment during sports activities. If you have firearms in your house, make sure you follow all gun safety procedures. Minimize exposure to UV radiation to reduce your risk of skin cancer. What's next? Visit your health care provider once a year for an annual wellness visit. Ask your health care provider how often you should have your eyes and teeth checked. Stay up to date  on all vaccines. This information is not intended to replace advice given to you by your health care provider. Make sure you discuss any questions you have with your health care provider. Document Revised: 11/10/2020 Document Reviewed: 11/10/2020 Elsevier Patient Education  2024 [employer].

## 2024-02-25 NOTE — Assessment & Plan Note (Signed)
 Check B12 with labs.
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
## 2024-02-25 NOTE — Assessment & Plan Note (Signed)
 Stable since our last visit.  He is still having occasional falls as above.  He did see neurology recently and they did discuss further evaluation however he declined.

## 2024-02-25 NOTE — Assessment & Plan Note (Signed)
 At goal today on benazepril  20 mg daily.

## 2024-02-26 ENCOUNTER — Ambulatory Visit: Payer: Self-pay | Admitting: Family Medicine

## 2024-02-26 NOTE — Progress Notes (Signed)
 His A1c is elevated into the diabetic range.  Recommend he come back here to discuss treatment options for this.  His triglycerides and VLDL are elevated.  He may benefit from starting a statin to improve these numbers and lower risk of heart attack and stroke.  Please send in Lipitor 40 mg daily if he is agreeable to start.  All of his other labs are stable and we can recheck in a year.

## 2024-02-27 ENCOUNTER — Other Ambulatory Visit: Payer: Self-pay | Admitting: *Deleted

## 2024-02-27 MED ORDER — ATORVASTATIN CALCIUM 40 MG PO TABS: 40.0000 mg | ORAL_TABLET | Freq: Every day | ORAL | 3 refills | Status: AC

## 2024-02-27 NOTE — Telephone Encounter (Signed)
 Rx Lipitor send to pharmacy F/U appt with PCP schedule  Patient notified

## 2024-03-03 ENCOUNTER — Encounter: Payer: Self-pay | Admitting: Family Medicine

## 2024-03-03 ENCOUNTER — Ambulatory Visit: Admitting: Family Medicine

## 2024-03-03 VITALS — BP 126/78 | HR 88 | Temp 97.2°F | Ht 75.0 in | Wt 260.2 lb

## 2024-03-03 DIAGNOSIS — E785 Hyperlipidemia, unspecified: Secondary | ICD-10-CM | POA: Diagnosis not present

## 2024-03-03 DIAGNOSIS — R739 Hyperglycemia, unspecified: Secondary | ICD-10-CM

## 2024-03-03 DIAGNOSIS — I10 Essential (primary) hypertension: Secondary | ICD-10-CM | POA: Diagnosis not present

## 2024-03-03 NOTE — Progress Notes (Signed)
 Still it you let them know about they like  Phillip Perry is a 68 y.o. male who presents today for an office visit.  Assessment/Plan:  Chronic Problems Addressed Today: Hyperglycemia Had a lengthy discussion with patient today regarding most recent A1c is 7.3.  Informed patient that this was in the diabetic range.  He is currently on metformin  500 mg daily.  We discussed treatment options including adjusting medications.  He would like to hold off on this for now.  Had extensive discussion regarding lifestyle modifications and need to reduce sugar.  He will come back in 3 months to recheck A1c.  Dyslipidemia Recommended starting Lipitor last week after his physical however he would like to hold off on this for now until he works on his diet and exercise.  He will come back in 3 months or so and we can recheck lipids at that time.  Essential hypertension At goal today on benazepril  20 mg daily.     Subjective:  HPI:  See assessment / plan for status of chronic conditions.   Discussed the use of AI scribe software for clinical note transcription with the patient, who gave verbal consent to proceed.  History of Present Illness Phillip Perry is a 68 year old male who presents for follow-up on blood sugar management.  He was seen last week and had recent blood work, which included an A1c test. He is currently on metformin  for blood sugar management. He consumes a protein drink for lunch but eats fast food two to three times a week. He is trying different cereals, such as Raisin Bran and Special K, but occasionally consumes Frosted Mini Wheats and Honey Nut Cheerios. He drinks grapefruit juice and milk regularly, with milk being consumed with cereal in the morning and with cookies at night. He has reduced sugar in his coffee from two spoonfuls to one.  He is concerned about his cholesterol levels, as his triglycerides and VLDL were higher than desired, and his HDL was lower. He has not yet started  Lipitor, which was prescribed for cholesterol management.  He works in an environment where food is often available, which can be challenging for dietary management.         Objective:  Physical Exam: BP 126/78   Pulse 88   Temp (!) 97.2 F (36.2 C) (Temporal)   Ht 6' 3 (1.905 m)   Wt 260 lb 3.2 oz (118 kg)   SpO2 95%   BMI 32.52 kg/m   Gen: No acute distress, resting comfortably Neuro: Grossly normal, moves all extremities Psych: Normal affect and thought content      Clemons Salvucci M. Kennyth, MD 03/03/2024 9:26 AM

## 2024-03-03 NOTE — Assessment & Plan Note (Signed)
 At goal today on benazepril  20 mg daily.

## 2024-03-03 NOTE — Patient Instructions (Addendum)
 It was very nice to see you today!  VISIT SUMMARY: Today, we discussed your blood sugar and cholesterol management. We reviewed your recent blood work and talked about dietary and lifestyle changes to help manage your conditions.  YOUR PLAN: TYPE 2 DIABETES MELLITUS: Your A1c test indicates prediabetes. We need to focus on dietary and lifestyle changes to manage your blood sugar levels. -No changes to your medication at this time. Continue taking metformin  as prescribed. -Reduce added sugars in your diet. Avoid high glycemic index foods and moderate your intake of high-carb foods like pizza and burgers. -Engage in regular exercise and practice mindful eating. -We will schedule a follow-up A1c test in a few months to monitor your progress.  HYPERLIPIDEMIA: Your triglycerides and VLDL levels are higher than desired, and your HDL is lower. You have not started taking Lipitor yet. -Focus on dietary changes to manage your triglycerides. Reduce your sugar intake. -Hold off on starting Lipitor for now. We will reassess in a few months. -We will schedule a follow-up cholesterol test in a few months to monitor your progress.  GENERAL HEALTH MAINTENANCE: We discussed general dietary changes for overall health maintenance. -Proceed with your scheduled colonoscopy in November. -Follow the dietary recommendations provided to reduce your sugar intake and improve your overall health.  Return if symptoms worsen or fail to improve.   Take care, Dr Kennyth  PLEASE NOTE:  If you had any lab tests, please let us  know if you have not heard back within a few days. You may see your results on mychart before we have a chance to review them but we will give you a call once they are reviewed by us .   If we ordered any referrals today, please let us  know if you have not heard from their office within the next week.   If you had any urgent prescriptions sent in today, please check with the pharmacy within an hour  of our visit to make sure the prescription was transmitted appropriately.   Please try these tips to maintain a healthy lifestyle:  Eat at least 3 REAL meals and 1-2 snacks per day.  Aim for no more than 5 hours between eating.  If you eat breakfast, please do so within one hour of getting up.   Each meal should contain half fruits/vegetables, one quarter protein, and one quarter carbs (no bigger than a computer mouse)  Cut down on sweet beverages. This includes juice, soda, and sweet tea.   Drink at least 1 glass of water with each meal and aim for at least 8 glasses per day  Exercise at least 150 minutes every week.

## 2024-03-03 NOTE — Assessment & Plan Note (Signed)
 Had a lengthy discussion with patient today regarding most recent A1c is 7.3.  Informed patient that this was in the diabetic range.  He is currently on metformin  500 mg daily.  We discussed treatment options including adjusting medications.  He would like to hold off on this for now.  Had extensive discussion regarding lifestyle modifications and need to reduce sugar.  He will come back in 3 months to recheck A1c.

## 2024-03-03 NOTE — Assessment & Plan Note (Signed)
 Recommended starting Lipitor last week after his physical however he would like to hold off on this for now until he works on his diet and exercise.  He will come back in 3 months or so and we can recheck lipids at that time.

## 2024-03-24 ENCOUNTER — Ambulatory Visit (AMBULATORY_SURGERY_CENTER)

## 2024-03-24 VITALS — Ht 75.0 in | Wt 260.0 lb

## 2024-03-24 DIAGNOSIS — Z860100 Personal history of colon polyps, unspecified: Secondary | ICD-10-CM

## 2024-03-24 MED ORDER — NA SULFATE-K SULFATE-MG SULF 17.5-3.13-1.6 GM/177ML PO SOLN: 1.0000 | Freq: Once | ORAL | 0 refills | Status: AC

## 2024-03-24 NOTE — Progress Notes (Signed)

## 2024-04-01 ENCOUNTER — Encounter: Payer: Self-pay | Admitting: Pediatrics
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
## 2024-04-07 ENCOUNTER — Ambulatory Visit: Admitting: Pediatrics

## 2024-04-07 ENCOUNTER — Encounter: Payer: Self-pay | Admitting: Pediatrics

## 2024-04-07 VITALS — BP 146/87 | HR 66 | Temp 98.1°F | Resp 13 | Ht 75.0 in | Wt 260.0 lb

## 2024-04-07 DIAGNOSIS — Z860101 Personal history of adenomatous and serrated colon polyps: Secondary | ICD-10-CM | POA: Diagnosis not present

## 2024-04-07 DIAGNOSIS — K635 Polyp of colon: Secondary | ICD-10-CM

## 2024-04-07 DIAGNOSIS — D128 Benign neoplasm of rectum: Secondary | ICD-10-CM | POA: Diagnosis not present

## 2024-04-07 DIAGNOSIS — Z1211 Encounter for screening for malignant neoplasm of colon: Secondary | ICD-10-CM

## 2024-04-07 DIAGNOSIS — K648 Other hemorrhoids: Secondary | ICD-10-CM

## 2024-04-07 DIAGNOSIS — D123 Benign neoplasm of transverse colon: Secondary | ICD-10-CM | POA: Diagnosis not present

## 2024-04-07 DIAGNOSIS — Z860100 Personal history of colon polyps, unspecified: Secondary | ICD-10-CM

## 2024-04-07 MED ORDER — SODIUM CHLORIDE 0.9 % IV SOLN: 500.0000 mL | Freq: Once | INTRAVENOUS | Status: DC

## 2024-04-07 NOTE — Progress Notes (Signed)
 Report to PACU, RN, vss, BBS= Clear.

## 2024-04-07 NOTE — Progress Notes (Signed)
 Called to room to assist during endoscopic procedure.  Patient ID and intended procedure confirmed with present staff. Received instructions for my participation in the procedure from the performing physician.

## 2024-04-07 NOTE — Patient Instructions (Addendum)
Handout on polyps and hemorrhoids provided.  Await pathology results.  YOU HAD AN ENDOSCOPIC PROCEDURE TODAY AT [facility]:   Refer to the procedure report that was given to you for any specific questions about what was found during the examination.  If the procedure report does not answer your questions, please call your gastroenterologist to clarify.  If you requested that your care partner not be given the details of your procedure findings, then the procedure report has been included in a sealed envelope for you to review at your convenience later.  YOU SHOULD EXPECT: Some feelings of bloating in the abdomen. Passage of more gas than usual.  Walking can help get rid of the air that was put into your GI tract during the procedure and reduce the bloating. If you had a lower endoscopy (such as a colonoscopy or flexible sigmoidoscopy) you may notice spotting of blood in your stool or on the toilet paper. If you underwent a bowel prep for your procedure, you may not have a normal bowel movement for a few days.  Please Note:  You might notice some irritation and congestion in your nose or some drainage.  This is from the oxygen used during your procedure.  There is no need for concern and it should clear up in a day or so.  SYMPTOMS TO REPORT IMMEDIATELY:  Following lower endoscopy (colonoscopy or flexible sigmoidoscopy):  Excessive amounts of blood in the stool  Significant tenderness or worsening of abdominal pains  Swelling of the abdomen that is new, acute  Fever of 100F or higher   For urgent or emergent issues, a gastroenterologist can be reached at any hour by calling [phone]. Do not use MyChart messaging for urgent concerns.    DIET:  We do recommend a small meal at first, but then you may proceed to your regular diet.  Drink plenty of fluids but you should avoid alcoholic beverages for 24 hours.  ACTIVITY:  You should plan to take it easy for the rest of  today and you should NOT DRIVE or use heavy machinery until tomorrow (because of the sedation medicines used during the test).    FOLLOW UP: Our staff will call the number listed on your records the next business day following your procedure.  We will call around 7:15- 8:00 am to check on you and address any questions or concerns that you may have regarding the information given to you following your procedure. If we do not reach you, we will leave a message.     If any biopsies were taken you will be contacted by phone or by letter within the next 1-3 weeks.  Please call us at [phone] if you have not heard about the biopsies in 3 weeks.    SIGNATURES/CONFIDENTIALITY: You and/or your care partner have signed paperwork which will be entered into your electronic medical record.  These signatures attest to the fact that that the information above on your After Visit Summary has been reviewed and is understood.  Full responsibility of the confidentiality of this discharge information lies with you and/or your care-partner.

## 2024-04-07 NOTE — Op Note (Signed)
 [facility] Patient Name: Phillip Perry Procedure Date: 04/07/2024 11:43 AM MRN: [id number] Endoscopist: Inocente Hausen , MD, [id number] Age: 68 Referring MD:  Date of Birth: Dec 04, 1955 Gender: Male Account #: [id number] Procedure:                Colonoscopy Indications:              High risk colon cancer surveillance: Personal                            history of sessile serrated colon polyp (less than                            10 mm in size) with no dysplasia, Last colonoscopy:                            October 2020 Medicines:                Monitored Anesthesia Care Procedure:                Pre-Anesthesia Assessment:                           - Prior to the procedure, a History and Physical                            was performed, and patient medications and                            allergies were reviewed. The patient's tolerance of                            previous anesthesia was also reviewed. The risks                            and benefits of the procedure and the sedation                            options and risks were discussed with the patient.                            All questions were answered, and informed consent                            was obtained. Prior Anticoagulants: The patient has                            taken no anticoagulant or antiplatelet agents. ASA                            Grade Assessment: II - A patient with mild systemic                            disease. After reviewing the risks and benefits,  the patient was deemed in satisfactory condition to                            undergo the procedure.                           After obtaining informed consent, the colonoscope                            was passed under direct vision. Throughout the                            procedure, the patient's blood pressure, pulse, and                            oxygen saturations were monitored continuously. The                             Olympus Scope SN: [id number] was introduced through                            the anus and advanced to the cecum, identified by                            appendiceal orifice and ileocecal valve. The                            colonoscopy was performed without difficulty. The                            patient tolerated the procedure well. The quality                            of the bowel preparation was good. The ileocecal                            valve, appendiceal orifice, and rectum were                            photographed. Scope In: 11:49:32 AM Scope Out: 12:09:34 PM Scope Withdrawal Time: 0 hours 15 minutes 58 seconds  Total Procedure Duration: 0 hours 20 minutes 2 seconds  Findings:                 The perianal and digital rectal examinations were                            normal. Pertinent negatives include normal                            sphincter tone and no palpable rectal lesions.                           Three sessile polyps were found in the rectum and  transverse colon. The polyps were 4 to 5 mm in                            size. These polyps were removed with a cold snare.                            Resection and retrieval were complete.                           Internal hemorrhoids were found during retroflexion. Complications:            No immediate complications. Estimated blood loss:                            Minimal. Estimated Blood Loss:     Estimated blood loss was minimal. Impression:               - Three 4 to 5 mm polyps in the rectum and in the                            transverse colon, removed with a cold snare.                            Resected and retrieved.                           - Internal hemorrhoids. Recommendation:           - Discharge patient to home (ambulatory).                           - Await pathology results.                           - Repeat colonoscopy for  surveillance based on                            pathology results.                           - The findings and recommendations were discussed                            with the patient's family.                           - Patient has a contact number available for                            emergencies. The signs and symptoms of potential                            delayed complications were discussed with the                            patient. Return to normal activities tomorrow.  Written discharge instructions were provided to the                            patient. Inocente Hausen, MD 04/07/2024 12:12:56 PM This report has been signed electronically.

## 2024-04-08 ENCOUNTER — Telehealth: Payer: Self-pay | Admitting: *Deleted

## 2024-04-08 NOTE — Telephone Encounter (Signed)
 No answer for post procedure call back. Left VM.

## 2024-04-10 ENCOUNTER — Ambulatory Visit: Payer: Self-pay | Admitting: Pediatrics

## 2024-04-10 LAB — SURGICAL PATHOLOGY

## 2024-06-23 ENCOUNTER — Other Ambulatory Visit

## 2024-06-27 ENCOUNTER — Other Ambulatory Visit (INDEPENDENT_AMBULATORY_CARE_PROVIDER_SITE_OTHER)

## 2024-06-27 ENCOUNTER — Ambulatory Visit: Payer: Self-pay | Admitting: Family Medicine

## 2024-06-27 DIAGNOSIS — E785 Hyperlipidemia, unspecified: Secondary | ICD-10-CM | POA: Diagnosis not present

## 2024-06-27 DIAGNOSIS — R739 Hyperglycemia, unspecified: Secondary | ICD-10-CM

## 2024-06-27 LAB — LIPID PANEL
Cholesterol: 160 mg/dL (ref 28–200)
HDL: 35.7 mg/dL — ABNORMAL LOW
LDL Cholesterol: 97 mg/dL (ref 10–99)
NonHDL: 124.33
Total CHOL/HDL Ratio: 4
Triglycerides: 135 mg/dL (ref 10.0–149.0)
VLDL: 27 mg/dL (ref 0.0–40.0)

## 2024-06-27 LAB — HEMOGLOBIN A1C: Hgb A1c MFr Bld: 6.7 % — ABNORMAL HIGH (ref 4.6–6.5)

## 2024-06-27 NOTE — Progress Notes (Signed)
 A1c is improved to 6.7 which is better than last time but still in the diabetic range.  We can discuss treatment options at his next office visit here.  Cholesterol levels are stable.  His LDL is 97 which is similar to the last time that we checked.

## 2025-03-02 ENCOUNTER — Encounter: Admitting: Family Medicine
